# Patient Record
Sex: Female | Born: 1980 | Race: White | Hispanic: No | Marital: Married | Smoking: Never smoker
Health system: Southern US, Community
[De-identification: ages and names within clinical notes are randomized; demographics above are authoritative.]

## PROBLEM LIST (undated history)

## (undated) DIAGNOSIS — F419 Anxiety disorder, unspecified: Secondary | ICD-10-CM

## (undated) DIAGNOSIS — C801 Malignant (primary) neoplasm, unspecified: Secondary | ICD-10-CM

## (undated) DIAGNOSIS — J45909 Unspecified asthma, uncomplicated: Secondary | ICD-10-CM

## (undated) DIAGNOSIS — L509 Urticaria, unspecified: Secondary | ICD-10-CM

## (undated) DIAGNOSIS — J069 Acute upper respiratory infection, unspecified: Secondary | ICD-10-CM

## (undated) DIAGNOSIS — I10 Essential (primary) hypertension: Secondary | ICD-10-CM

## (undated) DIAGNOSIS — J189 Pneumonia, unspecified organism: Secondary | ICD-10-CM

## (undated) HISTORY — PX: WISDOM TOOTH EXTRACTION: SHX21

## (undated) HISTORY — DX: Essential (primary) hypertension: I10

## (undated) HISTORY — DX: Acute upper respiratory infection, unspecified: J06.9

## (undated) HISTORY — DX: Urticaria, unspecified: L50.9

## (undated) HISTORY — DX: Unspecified asthma, uncomplicated: J45.909

---
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
## 2002-01-12 ENCOUNTER — Encounter: Payer: Self-pay | Admitting: *Deleted

## 2002-01-12 ENCOUNTER — Emergency Department (HOSPITAL_COMMUNITY): Admission: EM | Admit: 2002-01-12 | Discharge: 2002-01-12 | Payer: Self-pay | Admitting: *Deleted

## 2002-06-09 ENCOUNTER — Other Ambulatory Visit: Admission: RE | Admit: 2002-06-09 | Discharge: 2002-06-09 | Payer: Self-pay | Admitting: Dermatology

## 2005-04-17 ENCOUNTER — Ambulatory Visit (HOSPITAL_COMMUNITY): Admission: RE | Admit: 2005-04-17 | Discharge: 2005-04-17 | Payer: Self-pay | Admitting: Family Medicine

## 2007-12-03 ENCOUNTER — Emergency Department (HOSPITAL_COMMUNITY): Admission: EM | Admit: 2007-12-03 | Discharge: 2007-12-03 | Payer: Self-pay | Admitting: Emergency Medicine

## 2010-08-13 ENCOUNTER — Encounter: Payer: Self-pay | Admitting: Family Medicine

## 2015-03-21 ENCOUNTER — Other Ambulatory Visit (HOSPITAL_COMMUNITY): Payer: Self-pay | Admitting: Internal Medicine

## 2015-03-21 DIAGNOSIS — R1011 Right upper quadrant pain: Secondary | ICD-10-CM

## 2015-03-23 ENCOUNTER — Ambulatory Visit (HOSPITAL_COMMUNITY)
Admission: RE | Admit: 2015-03-23 | Discharge: 2015-03-23 | Disposition: A | Discharge status: Home / Self Care | Payer: BC Managed Care – PPO | Source: Ambulatory Visit | Attending: Internal Medicine | Admitting: Internal Medicine

## 2015-03-23 DIAGNOSIS — K760 Fatty (change of) liver, not elsewhere classified: Secondary | ICD-10-CM | POA: Diagnosis not present

## 2015-03-23 DIAGNOSIS — R1011 Right upper quadrant pain: Secondary | ICD-10-CM | POA: Diagnosis present

## 2015-03-23 DIAGNOSIS — R110 Nausea: Secondary | ICD-10-CM | POA: Diagnosis not present

## 2015-04-18 ENCOUNTER — Encounter: Payer: Self-pay | Admitting: General Surgery

## 2015-04-18 NOTE — Progress Notes (Signed)
Brianna Bowen 04/18/2015 1:56 PM Location: [facility] Patient #: [id number] DOB: March 28, 1981 Single / Language: Cleophus Molt / Race: White Female History of Present Illness Odis Hollingshead MD; 04/18/2015 2:25 PM) Patient words: gallstones.  The patient is a 34 year old female    Note:She is referred by Dr. Wende Neighbors because of right upper quadrant pain postprandially and small gallstones. She's been having these symptoms for a couple months now and they're associated with nausea. She also has some heartburn. Recent ultrasound demonstrated fatty infiltration of liver and small echogenic material in the gallbladder consistent with small stones and/or sludge. Symptoms are exacerbated by fatty meal. No family history of gallbladder disease. No history of hepatitis or jaundice. No major weight gain or loss in the past 6 months.  Other Problems Marjean Donna, CMA; 04/18/2015 1:57 PM) Anxiety Disorder Cholelithiasis Melanoma  Past Surgical History Marjean Donna, CMA; 04/18/2015 1:57 PM) Oral Surgery  Diagnostic Studies History Marjean Donna, CMA; 04/18/2015 1:57 PM) Colonoscopy never Pap Smear 1-5 years ago  Allergies Davy Pique Bynum, CMA; 04/18/2015 1:58 PM) Penicillamine *Assorted Classes**  Medication History Davy Pique Bynum, CMA; 04/18/2015 1:58 PM) No Current Medications Medications Reconciled  Social History Marjean Donna, CMA; 04/18/2015 1:57 PM) Alcohol use Moderate alcohol use. No caffeine use No drug use Tobacco use Never smoker.  Family History Marjean Donna, CMA; 04/18/2015 1:57 PM) Arthritis Father, Mother. Cerebrovascular Accident Father. Diabetes Mellitus Father, Mother. Hypertension Mother. Migraine Headache Mother.  Pregnancy / Birth History Marjean Donna, [place]; 04/18/2015 1:57 PM) Age at menarche 81 years. Gravida 0 Regular periods     Review of Systems Davy Pique Bynum CMA; 04/18/2015 1:57 PM) General Not Present- Appetite Loss, Chills,  Fatigue, Fever, Night Sweats, Weight Gain and Weight Loss. Skin Not Present- Change in Wart/Mole, Dryness, Hives, Jaundice, New Lesions, Non-Healing Wounds, Rash and Ulcer. HEENT Not Present- Earache, Hearing Loss, Hoarseness, Nose Bleed, Oral Ulcers, Ringing in the Ears, Seasonal Allergies, Sinus Pain, Sore Throat, Visual Disturbances, Wears glasses/contact lenses and Yellow Eyes. Respiratory Not Present- Bloody sputum, Chronic Cough, Difficulty Breathing, Snoring and Wheezing. Breast Not Present- Breast Mass, Breast Pain, Nipple Discharge and Skin Changes. Cardiovascular Not Present- Chest Pain, Difficulty Breathing Lying Down, Leg Cramps, Palpitations, Rapid Heart Rate, Shortness of Breath and Swelling of Extremities. Gastrointestinal Present- Abdominal Pain, Bloating, Chronic diarrhea, Indigestion and Nausea. Not Present- Bloody Stool, Change in Bowel Habits, Constipation, Difficulty Swallowing, Excessive gas, Gets full quickly at meals, Hemorrhoids, Rectal Pain and Vomiting. Female Genitourinary Not Present- Frequency, Nocturia, Painful Urination, Pelvic Pain and Urgency. Musculoskeletal Not Present- Back Pain, Joint Pain, Joint Stiffness, Muscle Pain, Muscle Weakness and Swelling of Extremities. Neurological Not Present- Decreased Memory, Fainting, Headaches, Numbness, Seizures, Tingling, Tremor, Trouble walking and Weakness. Psychiatric Not Present- Anxiety, Bipolar, Change in Sleep Pattern, Depression, Fearful and Frequent crying. Endocrine Not Present- Cold Intolerance, Excessive Hunger, Hair Changes, Heat Intolerance, Hot flashes and New Diabetes. Hematology Not Present- Easy Bruising, Excessive bleeding, Gland problems, HIV and Persistent Infections.  Vitals (Sonya Bynum CMA; 04/18/2015 1:58 PM) 04/18/2015 1:57 PM Weight: 306 lb Height: 62in Body Surface Area: 2.46 m Body Mass Index: 55.97 kg/m Temp.: 97.74F(Temporal)  Pulse: 71 (Regular)  BP: 138/78 (Sitting, Left Arm,  Standard)     Physical Exam Odis Hollingshead MD; 04/18/2015 2:26 PM)  The physical exam findings are as follows: Note:General: Morbidly obese female in NAD. Pleasant and cooperative.  HEENT: [place]/AT, no facial masses  EYES: EOMI, no icterus  CV: RRR, no murmur, no JVD.  CHEST: Breath  sounds equal and clear. Respirations nonlabored.  ABDOMEN: Soft,obese, nontender, nondistended, no masses, no organomegaly, no scars, no hernias.  SKIN: No jaundice or suspicious rashes.  NEUROLOGIC: Alert and oriented, answers questions appropriately, normal gait and station.  PSYCHIATRIC: Normal mood, affect , and behavior.    Assessment & Plan Odis Hollingshead MD; 04/18/2015 2:27 PM)  SYMPTOMATIC CHOLELITHIASIS (K80.20) Impression: We discussed options of strict low-fat diet and expected management or cholecystectomy. She would like to proceed with the cholecystectomy.  Plan: Laparoscopic cholecystectomy with cholangiogram. I have explained the procedure, risks, and aftercare of cholecystectomy. Risks include but are not limited to bleeding, infection, wound problems, anesthesia, diarrhea, bile leak, injury to common bile duct/liver/intestine. She seems to understand and agrees to proceed.  Jackolyn Confer, MD

## 2015-05-20 ENCOUNTER — Encounter (HOSPITAL_COMMUNITY)
Admission: RE | Admit: 2015-05-20 | Discharge: 2015-05-20 | Disposition: A | Discharge status: Home / Self Care | Payer: BC Managed Care – PPO | Source: Ambulatory Visit | Attending: General Surgery | Admitting: General Surgery

## 2015-05-20 ENCOUNTER — Encounter (HOSPITAL_COMMUNITY): Payer: Self-pay

## 2015-05-20 DIAGNOSIS — Z01812 Encounter for preprocedural laboratory examination: Secondary | ICD-10-CM | POA: Diagnosis not present

## 2015-05-20 DIAGNOSIS — K8020 Calculus of gallbladder without cholecystitis without obstruction: Secondary | ICD-10-CM | POA: Diagnosis not present

## 2015-05-20 HISTORY — DX: Malignant (primary) neoplasm, unspecified: C80.1

## 2015-05-20 HISTORY — DX: Pneumonia, unspecified organism: J18.9

## 2015-05-20 HISTORY — DX: Anxiety disorder, unspecified: F41.9

## 2015-05-20 LAB — CBC WITH DIFFERENTIAL/PLATELET
BASOS PCT: 0 %
Basophils Absolute: 0 10*3/uL (ref 0.0–0.1)
Eosinophils Absolute: 0.1 10*3/uL (ref 0.0–0.7)
Eosinophils Relative: 2 %
HCT: 39.5 % (ref 36.0–46.0)
HEMOGLOBIN: 13.1 g/dL (ref 12.0–15.0)
LYMPHS PCT: 28 %
Lymphs Abs: 2.2 10*3/uL (ref 0.7–4.0)
MCH: 28.7 pg (ref 26.0–34.0)
MCHC: 33.2 g/dL (ref 30.0–36.0)
MCV: 86.6 fL (ref 78.0–100.0)
MONO ABS: 0.5 10*3/uL (ref 0.1–1.0)
Monocytes Relative: 6 %
NEUTROS ABS: 4.8 10*3/uL (ref 1.7–7.7)
NEUTROS PCT: 64 %
PLATELETS: 283 10*3/uL (ref 150–400)
RBC: 4.56 MIL/uL (ref 3.87–5.11)
RDW: 12.8 % (ref 11.5–15.5)
WBC: 7.6 10*3/uL (ref 4.0–10.5)

## 2015-05-20 LAB — COMPREHENSIVE METABOLIC PANEL
ALBUMIN: 3.7 g/dL (ref 3.5–5.0)
ALK PHOS: 101 U/L (ref 38–126)
ALT: 21 U/L (ref 14–54)
ANION GAP: 7 (ref 5–15)
AST: 18 U/L (ref 15–41)
BUN: 12 mg/dL (ref 6–20)
CALCIUM: 9.3 mg/dL (ref 8.9–10.3)
CHLORIDE: 105 mmol/L (ref 101–111)
CO2: 23 mmol/L (ref 22–32)
CREATININE: 0.73 mg/dL (ref 0.44–1.00)
GFR calc non Af Amer: >60 mL/min (ref >60)
GLUCOSE: 108 mg/dL — AB (ref 65–99)
Potassium: 4.1 mmol/L (ref 3.5–5.1)
SODIUM: 135 mmol/L (ref 135–145)
Total Bilirubin: 0.5 mg/dL (ref 0.3–1.2)
Total Protein: 7 g/dL (ref 6.5–8.1)

## 2015-05-20 LAB — PROTIME-INR
INR: 1.03 (ref 0.00–1.49)
Prothrombin Time: 13.7 seconds (ref 11.6–15.2)

## 2015-05-20 LAB — HCG, SERUM, QUALITATIVE: PREG SERUM: NEGATIVE

## 2015-05-20 NOTE — Progress Notes (Signed)
PCP is Dr Wende Neighbors  Denies ever seeing a cardiologist. Denies ever having a card cath, stress test, or echo. Pt reports she had a reaction to PCN -hives Spoke with Jearld Fenton at Dr Bertrum Sol office informed her of pt allergy to PCN, states he will leave the Dr a note and get back to [name].

## 2015-05-20 NOTE — Progress Notes (Signed)
Dr Zella Richer called back and states as long as reaction to PCN is not anaphylaxis ok to give ancef.

## 2015-05-20 NOTE — Pre-Procedure Instructions (Signed)
Gustie Bobb Kohls  05/20/2015      [facility] - [place], [place] [address] Phone: [phone] Fax: [phone]    Your procedure is scheduled on Nov 8th.  Report to [facility] Admitting at 530 A.M.  Call this number if you have problems the morning of surgery:  [phone]   Remember:  Do not eat food or drink liquids after midnight.  Take these medicines the morning of surgery with A SIP OF WATER NA  Stop taking Aspirin, Aleve,Ibuprofen, BC"s, Goody's, Herbal medications, Fish Oil, Vitamins- 7 days prior to surgery   Do not wear jewelry, make-up or nail polish.  Do not wear lotions, powders, or perfumes.  You may wear deodorant.  Do not shave 48 hours prior to surgery.  Men may shave face and neck.  Do not bring valuables to the hospital.  [facility] is not responsible for any belongings or valuables.  Contacts, dentures or bridgework may not be worn into surgery.  Leave your suitcase in the car.  After surgery it may be brought to your room.  For patients admitted to the hospital, discharge time will be determined by your treatment team.  Patients discharged the day of surgery will not be allowed to drive home.   Special instructions:  [facility] - Preparing for Surgery  Before surgery, you can play an important role.  Because skin is not sterile, your skin needs to be as free of germs as possible.  You can reduce the number of germs on you skin by washing with CHG (chlorahexidine gluconate) soap before surgery.  CHG is an antiseptic cleaner which kills germs and bonds with the skin to continue killing germs even after washing.  Please DO NOT use if you have an allergy to CHG or antibacterial soaps.  If your skin becomes reddened/irritated stop using the CHG and inform your nurse when you arrive at Short Stay.  Do not shave (including legs and underarms) for at least 48 hours prior to the first CHG  shower.  You may shave your face.  Please follow these instructions carefully:   1.  Shower with CHG Soap the night before surgery and the  morning of Surgery.  2.  If you choose to wash your hair, wash your hair first as usual with your   normal shampoo.  3.  After you shampoo, rinse your hair and body thoroughly to remove the  Shampoo.  4.  Use CHG as you would any other liquid soap.  You can apply chg directly  to the skin and wash gently with scrungie or a clean washcloth.  5.  Apply the CHG Soap to your body ONLY FROM THE NECK DOWN.   Do not use on open wounds or open sores.  Avoid contact with your eyes, ears, mouth and genitals (private parts).  Wash genitals (private parts)  with your normal soap.  6.  Wash thoroughly, paying special attention to the area where your surgery  will be performed.  7.  Thoroughly rinse your body with warm water from the neck down.  8.  DO NOT shower/wash with your normal soap after using and rinsing off   the CHG Soap.  9.  Pat yourself dry with a clean towel.            10.  Wear clean pajamas.            11.  Place clean sheets on your bed the night of your first shower and do not  sleep with pets.  Day of Surgery  Do not apply any lotions/deoderants the morning of surgery.  Please wear clean clothes to the hospital/surgery center.     Please read over the following fact sheets that you were given. Pain Booklet, Coughing and Deep Breathing and Surgical Site Infection Prevention

## 2015-05-30 ENCOUNTER — Encounter (HOSPITAL_COMMUNITY): Payer: Self-pay | Admitting: Certified Registered Nurse Anesthetist

## 2015-05-30 DIAGNOSIS — K8020 Calculus of gallbladder without cholecystitis without obstruction: Secondary | ICD-10-CM | POA: Diagnosis present

## 2015-05-30 DIAGNOSIS — F419 Anxiety disorder, unspecified: Secondary | ICD-10-CM | POA: Insufficient documentation

## 2015-05-30 DIAGNOSIS — C439 Malignant melanoma of skin, unspecified: Secondary | ICD-10-CM | POA: Insufficient documentation

## 2015-05-30 MED ORDER — VANCOMYCIN HCL 10 G IV SOLR
1500.0000 mg | INTRAVENOUS | Status: AC
  Administered 2015-05-31: 1500 mg via INTRAVENOUS
  Filled 2015-05-30: qty 1500

## 2015-05-30 NOTE — Anesthesia Preprocedure Evaluation (Addendum)
Anesthesia Evaluation  Patient identified by MRN, date of birth, ID band Patient awake  General Assessment Comment: Brianna Bowen is a  pleasant 34 y/o female with history significant for symptomatic cholelithiasis, anxiety, and morbid obesity. Brianna Bowen presents today for a scheduled laparoscopic cholecystectomy with intraoperative cholangiogram by Dr. Zella Richer.   Reviewed: Allergy & Precautions, NPO status , Patient's Chart, lab work & pertinent test results  Airway Mallampati: I  TM Distance: >3 FB Neck ROM: Full    Dental no notable dental hx. (+) Teeth Intact, Dental Advisory Given   Pulmonary pneumonia, resolved,    breath sounds clear to auscultation + decreased breath sounds      Cardiovascular negative cardio ROS   Rhythm:Regular Rate:Normal     Neuro/Psych Anxiety negative neurological ROS     GI/Hepatic negative GI ROS, Neg liver ROS,   Endo/Other  Morbid obesity  Renal/GU negative Renal ROS     Musculoskeletal   Abdominal (+) + obese,  Abdomen: soft. Bowel sounds: normal.  Peds  Hematology   Anesthesia Other Findings Hx of melanoma  Reproductive/Obstetrics                          Lab Results  Component Value Date   WBC 7.6 05/20/2015   HGB 13.1 05/20/2015   HCT 39.5 05/20/2015   MCV 86.6 05/20/2015   PLT 283 05/20/2015     Chemistry      Component Value Date/Time   NA 135 05/20/2015 1037   K 4.1 05/20/2015 1037   CL 105 05/20/2015 1037   CO2 23 05/20/2015 1037   BUN 12 05/20/2015 1037   CREATININE 0.73 05/20/2015 1037      Component Value Date/Time   CALCIUM 9.3 05/20/2015 1037   ALKPHOS 101 05/20/2015 1037   AST 18 05/20/2015 1037   ALT 21 05/20/2015 1037   BILITOT 0.5 05/20/2015 1037     CT Head 12/03/07  IMPRESSION:  1. No acute intracranial abnormality. 2. 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  Anesthesia  Physical Anesthesia Plan  ASA: III  Anesthesia Plan: General   Post-op Pain Management:    Induction: Intravenous  Airway Management Planned: Oral ETT  Additional Equipment:   Intra-op Plan:   Post-operative Plan: Extubation in OR  Informed Consent: I have reviewed the patients History and Physical, chart, labs and discussed the procedure including the risks, benefits and alternatives for the proposed anesthesia with the patient or authorized representative who has indicated his/her understanding and acceptance.   Dental advisory given  Plan Discussed with: CRNA  Anesthesia Plan Comments:        Anesthesia Quick Evaluation

## 2015-05-31 ENCOUNTER — Encounter (HOSPITAL_COMMUNITY)
Admission: RE | Disposition: A | Discharge status: Home / Self Care | Payer: Self-pay | Source: Ambulatory Visit | Attending: General Surgery

## 2015-05-31 ENCOUNTER — Ambulatory Visit (HOSPITAL_COMMUNITY): Payer: BC Managed Care – PPO | Admitting: Certified Registered Nurse Anesthetist

## 2015-05-31 ENCOUNTER — Ambulatory Visit (HOSPITAL_COMMUNITY)
Admission: RE | Admit: 2015-05-31 | Discharge: 2015-05-31 | Disposition: A | Discharge status: Home / Self Care | Payer: BC Managed Care – PPO | Source: Ambulatory Visit | Attending: General Surgery | Admitting: General Surgery

## 2015-05-31 ENCOUNTER — Encounter (HOSPITAL_COMMUNITY): Payer: Self-pay | Admitting: *Deleted

## 2015-05-31 ENCOUNTER — Ambulatory Visit (HOSPITAL_COMMUNITY): Payer: BC Managed Care – PPO

## 2015-05-31 DIAGNOSIS — K824 Cholesterolosis of gallbladder: Secondary | ICD-10-CM | POA: Insufficient documentation

## 2015-05-31 DIAGNOSIS — K8020 Calculus of gallbladder without cholecystitis without obstruction: Secondary | ICD-10-CM | POA: Diagnosis not present

## 2015-05-31 HISTORY — PX: CHOLECYSTECTOMY: SHX55

## 2015-05-31 SURGERY — LAPAROSCOPIC CHOLECYSTECTOMY WITH INTRAOPERATIVE CHOLANGIOGRAM
Anesthesia: General | Site: Abdomen

## 2015-05-31 MED ORDER — ROCURONIUM BROMIDE 50 MG/5ML IV SOLN
INTRAVENOUS | Status: AC
  Filled 2015-05-31: qty 1

## 2015-05-31 MED ORDER — ACETAMINOPHEN 325 MG PO TABS: 650.0000 mg | ORAL_TABLET | ORAL | Status: DC | PRN

## 2015-05-31 MED ORDER — BUPIVACAINE-EPINEPHRINE (PF) 0.25% -1:200000 IJ SOLN
INTRAMUSCULAR | Status: AC
  Filled 2015-05-31: qty 30

## 2015-05-31 MED ORDER — MIDAZOLAM HCL 5 MG/5ML IJ SOLN
INTRAMUSCULAR | Status: DC | PRN
  Administered 2015-05-31 (×2): 1 mg via INTRAVENOUS

## 2015-05-31 MED ORDER — GLYCOPYRROLATE 0.2 MG/ML IJ SOLN
INTRAMUSCULAR | Status: DC | PRN
  Administered 2015-05-31: .8 mg via ORAL

## 2015-05-31 MED ORDER — ONDANSETRON HCL 4 MG/2ML IJ SOLN: 4.0000 mg | INTRAMUSCULAR | Status: DC | PRN

## 2015-05-31 MED ORDER — PROPOFOL 10 MG/ML IV BOLUS
INTRAVENOUS | Status: AC
  Filled 2015-05-31: qty 20

## 2015-05-31 MED ORDER — ACETAMINOPHEN 650 MG RE SUPP: 650.0000 mg | RECTAL | Status: DC | PRN

## 2015-05-31 MED ORDER — PROPOFOL 10 MG/ML IV BOLUS
INTRAVENOUS | Status: DC | PRN
  Administered 2015-05-31: 280 mg via INTRAVENOUS
  Administered 2015-05-31: 20 mg via INTRAVENOUS

## 2015-05-31 MED ORDER — LACTATED RINGERS IV SOLN
INTRAVENOUS | Status: DC | PRN
  Administered 2015-05-31: 07:00:00 via INTRAVENOUS

## 2015-05-31 MED ORDER — SODIUM CHLORIDE 0.9 % IR SOLN
Status: DC | PRN
  Administered 2015-05-31: 1000 mL

## 2015-05-31 MED ORDER — GLYCOPYRROLATE 0.2 MG/ML IJ SOLN
INTRAMUSCULAR | Status: AC
  Filled 2015-05-31: qty 1

## 2015-05-31 MED ORDER — BUPIVACAINE-EPINEPHRINE 0.25% -1:200000 IJ SOLN
INTRAMUSCULAR | Status: DC | PRN
  Administered 2015-05-31: 15 mL

## 2015-05-31 MED ORDER — HYDROMORPHONE HCL 1 MG/ML IJ SOLN
INTRAMUSCULAR | Status: AC
  Filled 2015-05-31: qty 1

## 2015-05-31 MED ORDER — FENTANYL CITRATE (PF) 250 MCG/5ML IJ SOLN
INTRAMUSCULAR | Status: AC
  Filled 2015-05-31: qty 5

## 2015-05-31 MED ORDER — ONDANSETRON HCL 4 MG PO TABS: 4.0000 mg | ORAL_TABLET | ORAL | Status: DC | PRN

## 2015-05-31 MED ORDER — DEXTROSE IN LACTATED RINGERS 5 % IV SOLN: INTRAVENOUS | Status: DC

## 2015-05-31 MED ORDER — ONDANSETRON HCL 4 MG/2ML IJ SOLN
INTRAMUSCULAR | Status: AC
  Filled 2015-05-31: qty 2

## 2015-05-31 MED ORDER — 0.9 % SODIUM CHLORIDE (POUR BTL) OPTIME
TOPICAL | Status: DC | PRN
  Administered 2015-05-31: 1000 mL

## 2015-05-31 MED ORDER — MEPERIDINE HCL 25 MG/ML IJ SOLN: 6.2500 mg | INTRAMUSCULAR | Status: DC | PRN

## 2015-05-31 MED ORDER — NEOSTIGMINE METHYLSULFATE 10 MG/10ML IV SOLN
INTRAVENOUS | Status: DC | PRN
  Administered 2015-05-31: 5 mg via INTRAVENOUS

## 2015-05-31 MED ORDER — MORPHINE SULFATE (PF) 2 MG/ML IV SOLN: 2.0000 mg | INTRAVENOUS | Status: DC | PRN

## 2015-05-31 MED ORDER — OXYCODONE HCL 5 MG PO TABS: 5.0000 mg | ORAL_TABLET | ORAL | Status: DC | PRN

## 2015-05-31 MED ORDER — LIDOCAINE HCL (CARDIAC) 20 MG/ML IV SOLN
INTRAVENOUS | Status: DC | PRN
  Administered 2015-05-31: 100 mg via INTRAVENOUS

## 2015-05-31 MED ORDER — ONDANSETRON HCL 4 MG/2ML IJ SOLN
INTRAMUSCULAR | Status: DC | PRN
  Administered 2015-05-31: 4 mg via INTRAVENOUS

## 2015-05-31 MED ORDER — SUCCINYLCHOLINE CHLORIDE 20 MG/ML IJ SOLN
INTRAMUSCULAR | Status: AC
  Filled 2015-05-31: qty 1

## 2015-05-31 MED ORDER — PROMETHAZINE HCL 25 MG/ML IJ SOLN: 6.2500 mg | INTRAMUSCULAR | Status: DC | PRN

## 2015-05-31 MED ORDER — EPHEDRINE SULFATE 50 MG/ML IJ SOLN
INTRAMUSCULAR | Status: AC
  Filled 2015-05-31: qty 1

## 2015-05-31 MED ORDER — LIDOCAINE HCL (CARDIAC) 20 MG/ML IV SOLN
INTRAVENOUS | Status: AC
  Filled 2015-05-31: qty 5

## 2015-05-31 MED ORDER — FENTANYL CITRATE (PF) 100 MCG/2ML IJ SOLN
INTRAMUSCULAR | Status: DC | PRN
  Administered 2015-05-31 (×5): 50 ug via INTRAVENOUS

## 2015-05-31 MED ORDER — DEXAMETHASONE SODIUM PHOSPHATE 10 MG/ML IJ SOLN
INTRAMUSCULAR | Status: DC | PRN
  Administered 2015-05-31: 10 mg via INTRAVENOUS

## 2015-05-31 MED ORDER — MIDAZOLAM HCL 2 MG/2ML IJ SOLN
INTRAMUSCULAR | Status: AC
  Filled 2015-05-31: qty 4

## 2015-05-31 MED ORDER — ROCURONIUM BROMIDE 100 MG/10ML IV SOLN
INTRAVENOUS | Status: DC | PRN
  Administered 2015-05-31: 40 mg via INTRAVENOUS
  Administered 2015-05-31 (×2): 10 mg via INTRAVENOUS

## 2015-05-31 MED ORDER — DEXAMETHASONE SODIUM PHOSPHATE 10 MG/ML IJ SOLN
INTRAMUSCULAR | Status: AC
  Filled 2015-05-31: qty 1

## 2015-05-31 MED ORDER — HYDROMORPHONE HCL 1 MG/ML IJ SOLN
0.2500 mg | INTRAMUSCULAR | Status: DC | PRN
  Administered 2015-05-31 (×2): 0.25 mg via INTRAVENOUS

## 2015-05-31 MED ORDER — STERILE WATER FOR INJECTION IJ SOLN
INTRAMUSCULAR | Status: AC
  Filled 2015-05-31: qty 10

## 2015-05-31 MED ORDER — IOHEXOL 300 MG/ML  SOLN
INTRAMUSCULAR | Status: DC | PRN
  Administered 2015-05-31: 6 mL

## 2015-05-31 SURGICAL SUPPLY — 51 items
APL SKNCLS STERI-STRIP NONHPOA (GAUZE/BANDAGES/DRESSINGS) ×1
APPLIER CLIP 5 13 M/L LIGAMAX5 (MISCELLANEOUS) ×3
APR CLP MED LRG 5 ANG JAW (MISCELLANEOUS) ×1
BAG SPEC RTRVL LRG 6X4 10 (ENDOMECHANICALS) ×1
BENZOIN TINCTURE PRP APPL 2/3 (GAUZE/BANDAGES/DRESSINGS) ×3 IMPLANT
CANISTER SUCTION 2500CC (MISCELLANEOUS) ×3 IMPLANT
CATH REDDICK CHOLANGI 4FR 50CM (CATHETERS) ×3 IMPLANT
CHLORAPREP W/TINT 26ML (MISCELLANEOUS) ×3 IMPLANT
CLIP APPLIE 5 13 M/L LIGAMAX5 (MISCELLANEOUS) ×1 IMPLANT
CLOSURE WOUND 1/2 X4 (GAUZE/BANDAGES/DRESSINGS) ×1
COVER MAYO STAND STRL (DRAPES) ×3 IMPLANT
COVER SURGICAL LIGHT HANDLE (MISCELLANEOUS) ×3 IMPLANT
DECANTER SPIKE VIAL GLASS SM (MISCELLANEOUS) ×4 IMPLANT
DRAPE C-ARM 42X72 X-RAY (DRAPES) ×3 IMPLANT
DRSG TEGADERM 2-3/8X2-3/4 SM (GAUZE/BANDAGES/DRESSINGS) ×14 IMPLANT
ELECT REM PT RETURN 9FT ADLT (ELECTROSURGICAL) ×3
ELECTRODE REM PT RTRN 9FT ADLT (ELECTROSURGICAL) ×1 IMPLANT
GAUZE SPONGE 2X2 8PLY STRL LF (GAUZE/BANDAGES/DRESSINGS) ×1 IMPLANT
GLOVE BIO SURGEON STRL SZ7 (GLOVE) ×2 IMPLANT
GLOVE BIO SURGEON STRL SZ7.5 (GLOVE) ×2 IMPLANT
GLOVE BIOGEL PI IND STRL 7.0 (GLOVE) IMPLANT
GLOVE BIOGEL PI IND STRL 7.5 (GLOVE) IMPLANT
GLOVE BIOGEL PI IND STRL 8 (GLOVE) ×1 IMPLANT
GLOVE BIOGEL PI INDICATOR 7.0 (GLOVE) ×2
GLOVE BIOGEL PI INDICATOR 7.5 (GLOVE) ×4
GLOVE BIOGEL PI INDICATOR 8 (GLOVE) ×2
GLOVE ECLIPSE 7.5 STRL STRAW (GLOVE) ×2 IMPLANT
GLOVE ECLIPSE 8.0 STRL XLNG CF (GLOVE) ×3 IMPLANT
GOWN STRL REUS W/ TWL LRG LVL3 (GOWN DISPOSABLE) ×3 IMPLANT
GOWN STRL REUS W/TWL LRG LVL3 (GOWN DISPOSABLE) ×9
IV CATH 14GX2 1/4 (CATHETERS) ×3 IMPLANT
KIT BASIN OR (CUSTOM PROCEDURE TRAY) ×3 IMPLANT
KIT ROOM TURNOVER OR (KITS) ×3 IMPLANT
NS IRRIG 1000ML POUR BTL (IV SOLUTION) ×3 IMPLANT
PAD ARMBOARD 7.5X6 YLW CONV (MISCELLANEOUS) ×3 IMPLANT
POUCH SPECIMEN RETRIEVAL 10MM (ENDOMECHANICALS) ×3 IMPLANT
SCISSORS LAP 5X35 DISP (ENDOMECHANICALS) ×3 IMPLANT
SET IRRIG TUBING LAPAROSCOPIC (IRRIGATION / IRRIGATOR) ×3 IMPLANT
SLEEVE ENDOPATH XCEL 5M (ENDOMECHANICALS) ×8 IMPLANT
SPECIMEN JAR SMALL (MISCELLANEOUS) ×3 IMPLANT
SPONGE GAUZE 2X2 STER 10/PKG (GAUZE/BANDAGES/DRESSINGS) ×2
STRIP CLOSURE SKIN 1/2X4 (GAUZE/BANDAGES/DRESSINGS) ×1 IMPLANT
SUT MNCRL AB 4-0 PS2 18 (SUTURE) ×2 IMPLANT
SUT MON AB 4-0 PC3 18 (SUTURE) ×3 IMPLANT
TOWEL OR 17X24 6PK STRL BLUE (TOWEL DISPOSABLE) ×1 IMPLANT
TOWEL OR 17X26 10 PK STRL BLUE (TOWEL DISPOSABLE) ×3 IMPLANT
TRAY LAPAROSCOPIC MC (CUSTOM PROCEDURE TRAY) ×3 IMPLANT
TROCAR XCEL BLUNT TIP 100MML (ENDOMECHANICALS) ×3 IMPLANT
TROCAR XCEL NON-BLD 11X100MML (ENDOMECHANICALS) IMPLANT
TROCAR XCEL NON-BLD 5MMX100MML (ENDOMECHANICALS) ×3 IMPLANT
TUBING INSUFFLATION (TUBING) ×3 IMPLANT

## 2015-05-31 NOTE — Op Note (Signed)
OPERATIVE NOTE-LAP CHOLECYSTECTOMY  Preoperative diagnosis:  Symptomatic cholelithiasis  Postoperative diagnosis:  same  Procedure: Laparoscopic cholecystectomy with cholangiogram.  Surgeon: Jackolyn Confer, M.D.  Asst.:  Ralene Ok,  M.D.  Anesthesia: General  Indication:   This is a 34 year old female with symptomatic cholelithiasis. She now presents for elective laparoscopic cholecystectomy.    Technique: She was brought to the operating room, placed supine on the operating table, and a general anesthetic was administered. The hair on the abdominal wall was clipped as was necessary. The abdominal wall was then sterilely prepped and draped. Local anesthetic (Marcaine) was infiltrated in the subumbilical region. A small subumbilical incision was made through the skin, subcutaneous tissue, fascia, and peritoneum entering the peritoneal cavity under direct vision. A pursestring suture of 0 Vicryl was placed around the edges of the fascia. A Hassan trocar was introduced into the peritoneal cavity and a pneumoperitoneum was created by insufflation of carbon dioxide gas. The laparoscope was introduced into the trocar and no underlying bleeding or organ injury was noted. He/She was then placed in the reverse Trendelenburg position with the right side tilted slightly up.  Three 5 mm trocars were then placed into the abdominal cavity under laparoscopic vision. One in the epigastric area, and 2 in the right upper quadrant area. She was morbidly obese and had a large amount of omentum obstructing the infundibular area of the gallbladder. Because of this, another 5 mm trocar was placed in the midepigastric area and the omentum was retracted inferiorly.The gallbladder was visualized and the fundus was grasped and retracted toward the right shoulder.  The infundibulum was mobilized with dissection close to the gallbladder and retracted laterally. The cystic duct was identified and a window was created  around it. The cystic artery was also identified and a window was created around it. The critical view was achieved. A clip was placed at the neck of the gallbladder. A small incision was made in the cystic duct. A cholangiocatheter was introduced through the anterior abdominal wall and placed in the cystic duct. A intraoperative cholangiogram was then performed.  Under real-time fluoroscopy, dilute contrast was injected into the cystic duct.  The common hepatic duct, the right and left hepatic ducts, and the common duct were all visualized. Contrast drained into the duodenum without obvious evidence of any obstructing ductal lesion. The final report is pending the Radiologist's interpretation.  The cholangiocatheter was removed, the cystic duct was clipped 3 times on the biliary side, and then the cystic duct was divided sharply. No bile leak was noted from the cystic duct stump.  The cystic artery was then clipped and divided. Following this the gallbladder was dissected free from the liver using electrocautery. The gallbladder was then placed in a retrieval bag and removed from the abdominal cavity through the subumbilical incision.  Multiple small gallstones were palpable in the gallbladder.  The gallbladder fossa was inspected, irrigated, and bleeding was controlled with electrocautery. Inspection showed that hemostasis was adequate and there was no evidence of bile leak.  The irrigation fluid was evacuated as much as possible.  The subumbilical trocar was removed and the fascial defect was closed by tightening and tying down the pursestring suture under laparoscopic vision.  The remaining trocars were removed and the pneumoperitoneum was released. The skin incisions were closed with 4-0 Monocryl subcuticular stitches. Steri-Strips and sterile dressings were applied.  The procedure was well-tolerated without any apparent complications. She was taken to the recovery room in satisfactory  condition.

## 2015-05-31 NOTE — H&P (Signed)
H & P   Brianna Bowen was referred by Dr. Wende Neighbors because of right upper quadrant pain postprandially and small gallstones. She's been having these symptoms for a couple months now and they're associated with nausea. She also has some heartburn. Recent ultrasound demonstrated fatty infiltration of liver and small echogenic material in the gallbladder consistent with small stones and/or sludge. Symptoms are exacerbated by fatty meal. No family history of gallbladder disease. No history of hepatitis or jaundice. No major weight gain or loss in the past 6 months.  Other Problems  Anxiety Disorder Cholelithiasis Melanoma  Past Surgical History  Oral Surgery  Diagnostic Studies History  Colonoscopy never Pap Smear 1-5 years ago  Allergies  Penicillamine *Assorted Classes**   Prior to Admission medications   Not on File     Social History  Alcohol use Moderate alcohol use. No caffeine use No drug use Tobacco use Never smoker.  Family History  Arthritis Father, Mother. Cerebrovascular Accident Father. Diabetes Mellitus Father, Mother. Hypertension Mother. Migraine Headache Mother.  Pregnancy / Birth History  Age at menarche 11 years. Gravida 0 Regular periods     Review of Systems  General Not Present- Appetite Loss, Chills, Fatigue, Fever, Night Sweats, Weight Gain and Weight Loss. Skin Not Present- Change in Wart/Mole, Dryness, Hives, Jaundice, New Lesions, Non-Healing Wounds, Rash and Ulcer. HEENT Not Present- Earache, Hearing Loss, Hoarseness, Nose Bleed, Oral Ulcers, Ringing in the Ears, Seasonal Allergies, Sinus Pain, Sore Throat, Visual Disturbances, Wears glasses/contact lenses and Yellow Eyes. Respiratory Not Present- Bloody sputum, Chronic Cough, Difficulty Breathing, Snoring and Wheezing. Breast Not Present- Breast Mass, Breast Pain, Nipple Discharge and Skin Changes. Cardiovascular Not Present- Chest Pain, Difficulty Breathing  Lying Down, Leg Cramps, Palpitations, Rapid Heart Rate, Shortness of Breath and Swelling of Extremities. Gastrointestinal Present- Abdominal Pain, Bloating, Chronic diarrhea, Indigestion and Nausea. Not Present- Bloody Stool, Change in Bowel Habits, Constipation, Difficulty Swallowing, Excessive gas, Gets full quickly at meals, Hemorrhoids, Rectal Pain and Vomiting. Female Genitourinary Not Present- Frequency, Nocturia, Painful Urination, Pelvic Pain and Urgency. Musculoskeletal Not Present- Back Pain, Joint Pain, Joint Stiffness, Muscle Pain, Muscle Weakness and Swelling of Extremities. Neurological Not Present- Decreased Memory, Fainting, Headaches, Numbness, Seizures, Tingling, Tremor, Trouble walking and Weakness. Psychiatric Not Present- Anxiety, Bipolar, Change in Sleep Pattern, Depression, Fearful and Frequent crying. Endocrine Not Present- Cold Intolerance, Excessive Hunger, Hair Changes, Heat Intolerance, Hot flashes and New Diabetes. Hematology Not Present- Easy Bruising, Excessive bleeding, Gland problems, HIV and Persistent Infections.  Physical Exam  The physical exam findings are as follows: Note:General: Morbidly obese female in NAD. Pleasant and cooperative.  HEENT: [place]/AT, no facial masses  EYES: EOMI, no icterus  CV: RRR, no murmur, no JVD.  CHEST: Breath sounds equal and clear. Respirations nonlabored.  ABDOMEN: Soft,obese, nontender, nondistended, no masses, no organomegaly, no scars, no hernias.  SKIN: No jaundice or suspicious rashes.  NEUROLOGIC: Alert and oriented, answers questions appropriately, normal gait and station.  PSYCHIATRIC: Normal mood, affect , and behavior.    Assessment & Plan  SYMPTOMATIC CHOLELITHIASIS (K80.20) Impression: We discussed options of strict low-fat diet and expected management or cholecystectomy. She would like to proceed with the cholecystectomy.  Plan: Laparoscopic cholecystectomy with cholangiogram. I have explained the  procedure, risks, and aftercare of cholecystectomy. Risks include but are not limited to bleeding, infection, wound problems, anesthesia, diarrhea, bile leak, injury to common bile duct/liver/intestine. She seems to understand and agrees to proceed.  Jackolyn Confer, MD

## 2015-05-31 NOTE — Anesthesia Postprocedure Evaluation (Signed)
  Anesthesia Post-op Note  Patient: Brianna Bowen  Procedure(s) Performed: Procedure(s): LAPAROSCOPIC CHOLECYSTECTOMY WITH INTRAOPERATIVE CHOLANGIOGRAM (N/A)  Patient Location: PACU  Anesthesia Type:General  Level of Consciousness: awake, alert  and oriented  Airway and Oxygen Therapy: Patient Spontanous Breathing  Post-op Pain: mild  Post-op Assessment: Post-op Vital signs reviewed and Patient's Cardiovascular Status Stable              Post-op Vital Signs: Reviewed  Last Vitals:  Filed Vitals:   05/31/15 1054  BP:   Pulse:   Temp: 36.2 C  Resp:     Complications: No apparent anesthesia complications

## 2015-05-31 NOTE — Anesthesia Procedure Notes (Signed)
Procedure Name: Intubation Date/Time: 05/31/2015 7:36 AM Performed by: Layla Maw Pre-anesthesia Checklist: Patient identified, Patient being monitored, Timeout performed, Emergency Drugs available and Suction available Patient Re-evaluated:Patient Re-evaluated prior to inductionOxygen Delivery Method: Circle System Utilized Preoxygenation: Pre-oxygenation with 100% oxygen Intubation Type: IV induction Ventilation: Mask ventilation without difficulty Laryngoscope Size: Miller and 3 Grade View: Grade I Tube type: Oral Tube size: 7.5 mm Number of attempts: 1 Airway Equipment and Method: Stylet and Patient positioned with wedge pillow Placement Confirmation: ETT inserted through vocal cords under direct vision,  positive ETCO2 and breath sounds checked- equal and bilateral Secured at: 21 cm Tube secured with: Tape Dental Injury: Teeth and Oropharynx as per pre-operative assessment

## 2015-05-31 NOTE — Discharge Instructions (Signed)
LAPAROSCOPIC SURGERY: POST OP INSTRUCTIONS  1. DIET: Follow a liquid diet with crackers and toast  the first 48 hours after arrival home, such as soup, liquids, crackers, etc.  Be sure to include lots of fluids daily.  Avoid fast food or heavy meals as your are more likely to get nauseated.  Eat a lowfat foods beginning your second day after surgery.   2. Take your usually prescribed home medications unless otherwise directed. 3. PAIN CONTROL: a. Pain is best controlled by a usual combination of three different methods TOGETHER: i. Ice/Heat ii. Over the counter pain medication iii. Prescription pain medication b. Most patients will experience some swelling and bruising around the incisions.  Ice packs or heating pads (30-60 minutes up to 6 times a day) will help. Use ice for the first few days to help decrease swelling and bruising, then switch to heat to help relax tight/sore spots and speed recovery.  Some people prefer to use ice alone, heat alone, alternating between ice & heat.  Experiment to what works for you.  Swelling and bruising can take several weeks to resolve.   c. It is helpful to take an over-the-counter pain medication regularly for the first few weeks.  Choose one of the following that works best for you: i. Naproxen (Aleve, etc)  Two 220mg  tabs twice a day ii. Ibuprofen (Advil, etc) Three 200mg  tabs four times a day (every meal & bedtime) iii. Acetaminophen (Tylenol, etc) 500-650mg  four times a day (every meal & bedtime) d. A  prescription for pain medication (such as oxycodone, hydrocodone, etc) should be given to you upon discharge.  Take your pain medication as prescribed.  i. If you are having problems/concerns with the prescription medicine (does not control pain, nausea, vomiting, rash, itching, etc), please call us [phone] to see if we need to switch you to a different pain medicine that will work better for you and/or control your side effect better. ii. If you need  a refill on your pain medication, please contact your pharmacy.  They will contact our office to request authorization. Prescriptions will not be filled after 5 pm or on week-ends. 4. Avoid getting constipated.  Between the surgery and the pain medications, it is common to experience some constipation.  Increasing fluid intake and taking a fiber supplement (such as Metamucil, Citrucel, FiberCon, MiraLax, etc) 1-2 times a day regularly will usually help prevent this problem from occurring.  A mild laxative (prune juice, Milk of Magnesia, MiraLax, etc) should be taken according to package directions if there are no bowel movements after 48 hours.   5. Watch out for diarrhea.  If you have many loose bowel movements, simplify your diet to bland foods & liquids for a few days.  Stop any stool softeners and decrease your fiber supplement.  Switching to mild anti-diarrheal medications (Kayopectate, Pepto Bismol) can help.  If this worsens or does not improve, please call us. 6. Wash / shower every day.  You may shower over the dressings as they are waterproof.  Continue to shower over incision(s) after the dressing is off. 7. Remove your waterproof bandages 3 days after surgery. Leave steri strips on until they fall off. You may leave the incision open to air.  You may replace a dressing/Band-Aid to cover the incision for comfort if you wish.  8. ACTIVITIES as tolerated:   a. You may resume regular (light) daily activities beginning the next day--such as daily self-care, walking, climbing stairs--gradually increasing light activities  as tolerated.  No heavy lifting (over 10 pounds), straining, or intense activities for 2 weeks. b. DO NOT PUSH THROUGH PAIN.  Let pain be your guide: If it hurts to do something, don't do it.  Pain is your body warning you to avoid that activity for another week until the pain goes down. c. You may drive when you are no longer taking prescription pain medication, you can comfortably  wear a seatbelt, and you can safely maneuver your car and apply brakes. d. Dennis Bast may have sexual intercourse when it is comfortable.  9. FOLLOW UP in our office a. Please call [facility] at [phone] to set up an appointment to see your surgeon in the office for a follow-up appointment approximately 2-3 weeks after your surgery. b. Make sure that you call for this appointment the day you arrive home to insure a convenient appointment time. 10. IF YOU HAVE DISABILITY OR FAMILY LEAVE FORMS, BRING THEM TO THE OFFICE FOR PROCESSING.  DO NOT GIVE THEM TO YOUR DOCTOR.  11.  Return to work/school:  Desk work/light activities in 5-7 days, full duty/activities in 2 weeks if pain-free.   WHEN TO CALL us [phone]: 1. Poor pain control 2. Reactions / problems with new medications (rash/itching, nausea, etc)  3. Fever over 101.5 F (38.5 C) 4. Inability to urinate 5. Nausea and/or vomiting 6. Worsening swelling or bruising 7. Continued bleeding from incision. 8. Increased pain, redness, or drainage from the incision   The clinic staff is available to answer your questions during regular business hours (8:30am-5pm).  Please dont hesitate to call and ask to speak to one of our nurses for clinical concerns.   If you have a medical emergency, go to the nearest emergency room or call 911.  A surgeon from [facility] is always on call at the [facility], [place], [place], [place], [place]  [address] ? MAIN: [phone] ? TOLL FREE: [phone] ?  FAX [phone] [email or web address]

## 2015-05-31 NOTE — Transfer of Care (Signed)
Immediate Anesthesia Transfer of Care Note  Patient: Brianna Bowen  Procedure(s) Performed: Procedure(s): LAPAROSCOPIC CHOLECYSTECTOMY WITH INTRAOPERATIVE CHOLANGIOGRAM (N/A)  Patient Location: PACU  Anesthesia Type:General  Level of Consciousness: awake, alert , oriented and patient cooperative  Airway & Oxygen Therapy: Patient Spontanous Breathing and Patient connected to nasal cannula oxygen  Post-op Assessment: Report given to RN, Post -op Vital signs reviewed and stable and Patient moving all extremities X 4  Post vital signs: Reviewed and stable  Last Vitals:  Filed Vitals:   05/31/15 0850  BP:   Pulse: 76  Temp: 36.6 C  Resp: 21    Complications: No apparent anesthesia complications

## 2015-06-01 ENCOUNTER — Encounter (HOSPITAL_COMMUNITY): Payer: Self-pay | Admitting: General Surgery

## 2015-06-22 ENCOUNTER — Encounter: Payer: Self-pay | Admitting: Family Medicine

## 2015-06-22 ENCOUNTER — Ambulatory Visit (INDEPENDENT_AMBULATORY_CARE_PROVIDER_SITE_OTHER): Payer: BC Managed Care – PPO | Admitting: Family Medicine

## 2015-06-22 DIAGNOSIS — R058 Other specified cough: Secondary | ICD-10-CM

## 2015-06-22 DIAGNOSIS — R05 Cough: Secondary | ICD-10-CM

## 2015-06-22 MED ORDER — ALBUTEROL SULFATE HFA 108 (90 BASE) MCG/ACT IN AERS: 2.0000 | INHALATION_SPRAY | RESPIRATORY_TRACT | Status: DC | PRN

## 2015-06-22 MED ORDER — HYDROCOD POLST-CPM POLST ER 10-8 MG/5ML PO SUER: 5.0000 mL | Freq: Two times a day (BID) | ORAL | Status: DC | PRN

## 2015-06-22 NOTE — Progress Notes (Signed)
Subjective:    Patient ID: Brianna Bowen, female    DOB: 1981-01-26, 34 y.o.   MRN: [id number]  HPI This is a pleasant 34 yo female who has had a cough for 5 weeks. She started with nasal congestion and slight sore throat, these symptoms resolved, but has had persistent cough. Some sputum in the morning. Dry rest of the day. She has not history of smoking, allergies or asthma. She had a lap choley 3 weeks ago and did not cough while in the hospital. She feels her recovery is going well and she feels well other than cough which is keeping her up at night. She has been taking Mucinex without relief.   Past Medical History  Diagnosis Date  . Pneumonia   . Anxiety   . Cancer ([place])      skin cancer melamona   Past Surgical History  Procedure Laterality Date  . Wisdom tooth extraction    . Cholecystectomy N/A 05/31/2015    Procedure: LAPAROSCOPIC CHOLECYSTECTOMY WITH INTRAOPERATIVE CHOLANGIOGRAM;  Surgeon: Jackolyn Confer, MD;  Location: [facility];  Service: General;  Laterality: N/A;   Family History  Problem Relation Age of Onset  . Diabetes Mother   . Hypertension Mother   . Hyperlipidemia Mother   . Diabetes Maternal Grandmother   . Cancer Maternal Grandmother     ovarian  . Hyperlipidemia Maternal Grandmother   . Emphysema Maternal Grandfather   . Diabetes Maternal Grandfather   . Heart disease Maternal Grandfather   . Hypertension Maternal Grandfather   . Diabetes Paternal Grandfather   . Heart disease Paternal Grandfather   . Hyperlipidemia Paternal Grandfather   . Hypertension Paternal Grandfather    Social History  Substance Use Topics  . Smoking status: Never Smoker   . Smokeless tobacco: None  . Alcohol Use: 0.6 - 1.2 oz/week    1-2 Standard drinks or equivalent per week     Comment: socially wine - monthly    Review of Systems  Constitutional: Negative for fever, chills and fatigue.  HENT: Negative for ear pain, postnasal drip, rhinorrhea and sinus pressure.     Respiratory: Positive for cough. Negative for shortness of breath and wheezing.   Cardiovascular: Negative for chest pain, palpitations and leg swelling.      Objective:   Physical Exam  Constitutional: She is oriented to person, place, and time. She appears well-developed and well-nourished. No distress.  HENT:  Head: Normocephalic and atraumatic.  Right Ear: Tympanic membrane, external ear and ear canal normal.  Left Ear: Tympanic membrane, external ear and ear canal normal.  Nose: Nose normal.  Mouth/Throat: Oropharynx is clear and moist. No oropharyngeal exudate.  Eyes: Conjunctivae are normal.  Neck: Normal range of motion. Neck supple.  Cardiovascular: Normal rate, regular rhythm and normal heart sounds.   Pulmonary/Chest: Effort normal and breath sounds normal. No respiratory distress. She has no wheezes. She has no rales. She exhibits no tenderness.  Musculoskeletal: Normal range of motion.  Neurological: She is alert and oriented to person, place, and time.  Skin: Skin is warm and dry. She is not diaphoretic.  Psychiatric: She has a normal mood and affect. Her behavior is normal. Judgment and thought content normal.  Vitals reviewed.  BP 134/90 mmHg  Pulse 82  Temp(Src) 97.9 F (36.6 C) (Oral)  Resp 16  Ht 5\' 4"  (1.626 m)  Wt 305 lb (138.347 kg)  BMI 52.33 kg/m2  SpO2 98%  LMP 05/29/2015     Assessment &  Plan:  1. Post-viral cough syndrome - chlorpheniramine-HYDROcodone (TUSSIONEX PENNKINETIC ER) 10-8 MG/5ML SUER; Take 5 mLs by mouth every 12 (twelve) hours as needed for cough.  Dispense: 115 mL; Refill: 0 - albuterol (PROVENTIL HFA;VENTOLIN HFA) 108 (90 BASE) MCG/ACT inhaler; Inhale 2 puffs into the lungs every 4 (four) hours as needed for wheezing or shortness of breath (cough, shortness of breath or wheezing.).  Dispense: 1 Inhaler; Refill: 1 - encouraged good hydration, can continue mucinex prn - RTC if no improvement with above measures or if she develops  fever/chills, purulent/bloody sputum, SOB/wheeze  Clarene Reamer, FNP-BC  [facility]  06/27/2015 8:11 AM

## 2015-06-22 NOTE — Patient Instructions (Addendum)
For sinus congestion-  Take sudafed in the morning then another dose in the afternoon Use Afrin spray twice a day for three days  Cough, Adult Coughing is a reflex that clears your throat and your airways. Coughing helps to heal and protect your lungs. It is normal to cough occasionally, but a cough that happens with other symptoms or lasts a long time may be a sign of a condition that needs treatment. A cough may last only 2-3 weeks (acute), or it may last longer than 8 weeks (chronic). CAUSES Coughing is commonly caused by:  Breathing in substances that irritate your lungs.  A viral or bacterial respiratory infection.  Allergies.  Asthma.  Postnasal drip.  Smoking.  Acid backing up from the stomach into the esophagus (gastroesophageal reflux).  Certain medicines.  Chronic lung problems, including COPD (or rarely, lung cancer).  Other medical conditions such as heart failure. HOME CARE INSTRUCTIONS  Pay attention to any changes in your symptoms. Take these actions to help with your discomfort:  Take medicines only as told by your health care provider.  If you were prescribed an antibiotic medicine, take it as told by your health care provider. Do not stop taking the antibiotic even if you start to feel better.  Talk with your health care provider before you take a cough suppressant medicine.  Drink enough fluid to keep your urine clear or pale yellow.  If the air is dry, use a cold steam vaporizer or humidifier in your bedroom or your home to help loosen secretions.  Avoid anything that causes you to cough at work or at home.  If your cough is worse at night, try sleeping in a semi-upright position.  Avoid cigarette smoke. If you smoke, quit smoking. If you need help quitting, ask your health care provider.  Avoid caffeine.  Avoid alcohol.  Rest as needed. SEEK MEDICAL CARE IF:   You have new symptoms.  You cough up pus.  Your cough does not get better after  2-3 weeks, or your cough gets worse.  You cannot control your cough with suppressant medicines and you are losing sleep.  You develop pain that is getting worse or pain that is not controlled with pain medicines.  You have a fever.  You have unexplained weight loss.  You have night sweats. SEEK IMMEDIATE MEDICAL CARE IF:  You cough up blood.  You have difficulty breathing.  Your heartbeat is very fast.   This information is not intended to replace advice given to you by your health care provider. Make sure you discuss any questions you have with your health care provider.   Document Released: 01/05/2011 Document Revised: 03/30/2015 Document Reviewed: 09/15/2014 Elsevier Interactive Patient Education [employer].

## 2015-06-30 ENCOUNTER — Telehealth: Payer: Self-pay

## 2015-06-30 NOTE — Telephone Encounter (Signed)
Called patient and left her a message to return my call. Phone number provided.

## 2015-06-30 NOTE — Telephone Encounter (Signed)
Patient states that she is having dry cough all day and night. She finished all the meds you prescribed and is using her inhaler and the cough syrup at night. No congestion present.

## 2015-06-30 NOTE — Telephone Encounter (Signed)
Pt is still not any better from her visit with debbie and would like to know what to do nect

## 2015-07-01 ENCOUNTER — Other Ambulatory Visit: Payer: Self-pay | Admitting: Family Medicine

## 2015-07-01 DIAGNOSIS — R059 Cough, unspecified: Secondary | ICD-10-CM

## 2015-07-01 DIAGNOSIS — J209 Acute bronchitis, unspecified: Secondary | ICD-10-CM

## 2015-07-01 DIAGNOSIS — R05 Cough: Secondary | ICD-10-CM

## 2015-07-01 MED ORDER — PREDNISONE 20 MG PO TABS: ORAL_TABLET | ORAL | Status: DC

## 2015-07-01 NOTE — Telephone Encounter (Signed)
Called patient on mobile number. No answer. Left her a message again with my call back information.

## 2015-07-01 NOTE — Telephone Encounter (Signed)
Patient returned my call. Continues to cough. Otherwise feels well. Sleeping ok at night. Change of temperature or laughing increases cough. Albuterol inhaler seems to increase cough. She has required prednisone in past for cough. Sent in prescription to her pharmacy. Discussed possible side effects. She will let me know if she is not better in 3 days.

## 2015-08-02 ENCOUNTER — Ambulatory Visit (HOSPITAL_COMMUNITY)
Admission: RE | Admit: 2015-08-02 | Discharge: 2015-08-02 | Disposition: A | Discharge status: Home / Self Care | Payer: BC Managed Care – PPO | Source: Ambulatory Visit | Attending: Internal Medicine | Admitting: Internal Medicine

## 2015-08-02 ENCOUNTER — Other Ambulatory Visit (HOSPITAL_COMMUNITY): Payer: Self-pay | Admitting: Internal Medicine

## 2015-08-02 DIAGNOSIS — R05 Cough: Secondary | ICD-10-CM | POA: Insufficient documentation

## 2015-08-02 DIAGNOSIS — R059 Cough, unspecified: Secondary | ICD-10-CM

## 2015-08-09 ENCOUNTER — Other Ambulatory Visit (HOSPITAL_COMMUNITY): Payer: Self-pay | Admitting: Respiratory Therapy

## 2015-08-09 DIAGNOSIS — R062 Wheezing: Secondary | ICD-10-CM

## 2015-08-10 ENCOUNTER — Ambulatory Visit (HOSPITAL_COMMUNITY)
Admission: RE | Admit: 2015-08-10 | Discharge: 2015-08-10 | Disposition: A | Discharge status: Home / Self Care | Payer: BC Managed Care – PPO | Source: Ambulatory Visit | Attending: Internal Medicine | Admitting: Internal Medicine

## 2015-08-10 DIAGNOSIS — Z0189 Encounter for other specified special examinations: Secondary | ICD-10-CM | POA: Insufficient documentation

## 2015-08-10 DIAGNOSIS — R05 Cough: Secondary | ICD-10-CM | POA: Insufficient documentation

## 2015-08-10 DIAGNOSIS — R062 Wheezing: Secondary | ICD-10-CM | POA: Diagnosis present

## 2015-08-10 LAB — PULMONARY FUNCTION TEST
DL/VA % pred: 98 %
DL/VA: 4.71 ml/min/mmHg/L
DLCO UNC: 24.99 ml/min/mmHg
DLCO unc % pred: 103 %
FEF 25-75 Pre: 4.65 L/sec
FEF2575-%PRED-PRE: 137 %
FEV1-%PRED-PRE: 118 %
FEV1-Pre: 3.75 L
FEV1FVC-%PRED-PRE: 103 %
FEV6-%PRED-PRE: 116 %
FEV6-PRE: 4.32 L
FEV6FVC-%Pred-Pre: 101 %
FVC-%PRED-PRE: 115 %
FVC-PRE: 4.33 L
Pre FEV1/FVC ratio: 87 %
Pre FEV6/FVC Ratio: 100 %
RV % PRED: 100 %
RV: 1.44 L
TLC % pred: 113 %
TLC: 5.72 L

## 2015-09-27 ENCOUNTER — Encounter: Payer: Self-pay | Admitting: Allergy and Immunology

## 2015-09-27 ENCOUNTER — Ambulatory Visit (INDEPENDENT_AMBULATORY_CARE_PROVIDER_SITE_OTHER): Payer: BC Managed Care – PPO | Admitting: Allergy and Immunology

## 2015-09-27 VITALS — BP 108/86 | HR 104 | Temp 99.0°F | Resp 12 | Ht 63.5 in | Wt 313.1 lb

## 2015-09-27 DIAGNOSIS — J309 Allergic rhinitis, unspecified: Secondary | ICD-10-CM

## 2015-09-27 DIAGNOSIS — H1010 Acute atopic conjunctivitis, unspecified eye: Secondary | ICD-10-CM

## 2015-09-27 DIAGNOSIS — R059 Cough, unspecified: Secondary | ICD-10-CM

## 2015-09-27 DIAGNOSIS — R05 Cough: Secondary | ICD-10-CM | POA: Diagnosis not present

## 2015-09-27 MED ORDER — ALBUTEROL SULFATE HFA 108 (90 BASE) MCG/ACT IN AERS: 2.0000 | INHALATION_SPRAY | RESPIRATORY_TRACT | Status: DC | PRN

## 2015-09-27 MED ORDER — LEVOCETIRIZINE DIHYDROCHLORIDE 5 MG PO TABS: 5.0000 mg | ORAL_TABLET | Freq: Every evening | ORAL | Status: DC

## 2015-09-27 MED ORDER — MONTELUKAST SODIUM 10 MG PO TABS: 10.0000 mg | ORAL_TABLET | Freq: Every day | ORAL | Status: DC

## 2015-09-27 MED ORDER — BECLOMETHASONE DIPROPIONATE 80 MCG/ACT IN AERS: 2.0000 | INHALATION_SPRAY | Freq: Two times a day (BID) | RESPIRATORY_TRACT | Status: DC

## 2015-09-27 NOTE — Patient Instructions (Signed)
Take Home Sheet  1. Avoidance: Mite   2. Antihistamine: Xzyal 5mg   by mouth once daily for runny nose or itching.   3. Nasal Spray: Rhinocort AQ 1-2 spray(s) each nostril once daily for stuffy nose or drainage.    4. Inhalers:  Rescue: ProAir 2 puffs every 4 hours as needed for cough or wheeze.       -May use 2 puffs 10-20 minutes prior to exercise.   Preventative: QVAR 76mcg 2 puffs twice daily (Rinse, gargle, and spit out after use).   5.  Other: Continue Singulair 10mg  each evening.   7. Nasal Saline wash each evening at shower time.  8. Follow up Visit: 6 weeks or sooner if needed.   Websites that have reliable Patient information: 1. American Academy of Asthma, Allergy, & Immunology: www.aaaai.org 2. Food Allergy Network: www.foodallergy.org 3. Mothers of Asthmatics: www.aanma.org 4. [place]: DiningCalendar.de 5. American College of Allergy, Asthma, & Immunology: https://robertson.info/ or www.acaai.org  Control of House Dust Mite Allergen  House dust mites play a major role in allergic asthma and rhinitis.  They occur in environments with high humidity wherever human skin, the food for dust mites is found. High levels have been detected in dust obtained from mattresses, pillows, carpets, upholstered furniture, bed covers, clothes and soft toys.  The principal allergen of the house dust mite is found in its feces.  A gram of dust may contain 1,000 mites and 250,000 fecal particles.  Mite antigen is easily measured in the air during house cleaning activities.  1. Encase mattresses, including the box spring, and pillow, in an air tight cover.  Seal the zipper end of the encased mattresses with wide adhesive tape. 2. Wash the bedding in water of 130 degrees Farenheit weekly.  Avoid cotton comforters/quilts and flannel bedding: the most ideal bed covering is the dacron comforter. 3. Remove all upholstered furniture from the bedroom. 4. Remove  carpets, carpet padding, rugs, and non-washable window drapes from the bedroom.  Wash drapes weekly or use plastic window coverings. 5. Remove all non-washable stuffed toys from the bedroom.  Wash stuffed toys weekly. 6. Have the room cleaned frequently with a vacuum cleaner and a damp dust-mop.  The patient should not be in a room which is being cleaned and should wait 1 hour after cleaning before going into the room. 7. Close and seal all heating outlets in the bedroom.  Otherwise, the room will become filled with dust-laden air.  An electric heater can be used to heat the room. 8. Reduce indoor humidity to less than 50%.  Do not use a humidifier.

## 2015-09-27 NOTE — Progress Notes (Signed)
NEW PATIENT NOTE  RE: Brianna Bowen MRN: [id number] DOB: January 17, 1981 [facility] [place] [address] [place], [place] [address] Date of Office Visit: 09/27/2015  Dear Celene Squibb, MD:  I had the pleasure of seeing Brianna Bowen  today in initial evaluation as you recall-- Subjective:  [name] is a 35 y.o. female who presents today for Cough and Wheezing  Assessment:   1. Cough and previous history of wheeze,  Probable component of persistent asthma.   2. Allergic rhinoconjunctivitis, perennial hypersensitivities.    Plan:   Meds ordered this encounter  Medications  . levocetirizine (XYZAL) 5 MG tablet    Sig: Take 1 tablet (5 mg total) by mouth every evening.    Dispense:  30 tablet    Refill:  5  . albuterol (PROAIR HFA) 108 (90 Base) MCG/ACT inhaler    Sig: Inhale 2 puffs into the lungs every 4 (four) hours as needed for wheezing or shortness of breath (cough).    Dispense:  1 Inhaler    Refill:  1  . beclomethasone (QVAR) 80 MCG/ACT inhaler    Sig: Inhale 2 puffs into the lungs 2 (two) times daily.    Dispense:  1 Inhaler    Refill:  3  . montelukast (SINGULAIR) 10 MG tablet    Sig: Take 1 tablet (10 mg total) by mouth at bedtime.    Dispense:  30 tablet    Refill:  5   Patient Instructions  1. Avoidance: Dust Mite 2. Antihistamine: Xzyal 5mg   by mouth once daily for runny nose or itching. 3. Nasal Spray: Rhinocort AQ 1-2 spray(s) each nostril once daily for stuffy nose or drainage.  4. Inhalers:  Rescue: ProAir 2 puffs every 4 hours as needed for cough or wheeze.       -May use 2 puffs 10-20 minutes prior to exercise.  Preventative: Increase to QVAR 48mcg 2 puffs twice daily (Rinse, gargle, and spit out after use). 5.  Other: Continue Singulair 10mg  each evening. 6. Nasal Saline wash each evening at shower time. 7. Follow up Visit: 6 weeks or sooner if needed.  HPI: Brianna Bowen presents with year-round rhinorrhea, congestion, sneezing,  itchy watery eyes, cough and intermittent associated wheeze and shortness of breath.  She recalls bronchitis a few times a year, but greater difficulty this year.  After completing prednisone and antibiotics,  she describes about 75% improvement since recurring symptoms began about October and maintenance medications were begun in January.  She does not recall a diagnosis of asthma, but recalls bronchitis a few times a year and environmental aeroallergen positive testing about age 65 years.  She generally feels pollen, mold, outdoors, fluctuant weather patterns and perfumes are provoking factors for her symptoms though no sinus infections, food sensitivities or reflux.  Denies ED or Urgent care visits.  She did have a chest x-ray at [facility] and normal PFTs in the recent months.  Medical History: Past Medical History  Diagnosis Date  . Pneumonia   . Anxiety   . Cancer ([place])      skin cancer melamona  . Recurrent upper respiratory infection (URI)   . Urticaria     from penicillin   Surgical History: Past Surgical History  Procedure Laterality Date  . Wisdom tooth extraction    . Cholecystectomy N/A 05/31/2015    Procedure: LAPAROSCOPIC CHOLECYSTECTOMY WITH INTRAOPERATIVE CHOLANGIOGRAM;  Surgeon: Jackolyn Confer, MD;  Location: [place];  Service: General;  Laterality: N/A;  Family History: Family History  Problem Relation Age of Onset  . Diabetes Mother   . Hypertension Mother   . Hyperlipidemia Mother   . Allergic rhinitis Mother   . Diabetes Maternal Grandmother   . Cancer Maternal Grandmother     ovarian  . Hyperlipidemia Maternal Grandmother   . Emphysema Maternal Grandfather   . Diabetes Maternal Grandfather   . Heart disease Maternal Grandfather   . Hypertension Maternal Grandfather   . Diabetes Paternal Grandfather   . Heart disease Paternal Grandfather   . Hyperlipidemia Paternal Grandfather   . Hypertension Paternal Grandfather   . Allergic rhinitis Father   . Allergic  rhinitis Sister   . Asthma Neg Hx   . Eczema Neg Hx   . Immunodeficiency Neg Hx   . Urticaria Neg Hx   . Angioedema Neg Hx    Social History: Social History  . Marital Status: Single    Spouse Name: N/A  . Number of Children: N/A  . Years of Education: N/A   Social History Main Topics  . Smoking status: Never Smoker   . Smokeless tobacco: Never Used  . Alcohol Use: 0.6 - 1.2 oz/week    1-2 Standard drinks or equivalent per week     Comment: socially wine - monthly  . Drug Use: No  . 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  Alexsis has a current medication list which includes the following prescription(s): beclomethasone, chlorpheniramine-hydrocodone, fluticasone, gabapentin, montelukast and albuterol.   Drug Allergies: Allergies  Allergen Reactions  . Penicillins Hives   Environmental History: [place] lives in a 32 year old house for 9 years with carpeted floors, with central heat and air; stuffed mattress, non-feather pillow/comforter without humidifier, pets and smokers.   Review of Systems  Constitutional: Negative for fever, weight loss and malaise/fatigue.       Childhood varicella disease.  HENT: Positive for congestion. Negative for ear pain, hearing loss, nosebleeds and sore throat.   Eyes: Negative for discharge and redness.  Respiratory: Negative for shortness of breath.        Denies history of bronchitis and pneumonia. See HPI.  Gastrointestinal: Positive for heartburn (Rare episodes.). Negative for nausea, vomiting, abdominal pain, diarrhea and constipation.  Genitourinary: Negative.   Musculoskeletal: Negative for myalgias and joint pain.  Skin: Negative.  Negative for itching and rash.  Neurological: Negative.  Negative for dizziness, seizures, weakness and headaches.  Endo/Heme/Allergies: Positive for environmental allergies.       Denies sensitivity to aspirin, NSAIDs,  stinging insects, foods, latex, jewelry and cosmetics.  Immunological: No chronic or recurring infections. Objective:   Filed Vitals:   09/27/15 1341  BP: 108/86  Pulse: 104  Temp: 99 F (37.2 C)  Resp: 12   SpO2 Readings from Last 1 Encounters:  09/27/15 97%   Physical Exam  Constitutional: She is well-developed, well-nourished, and in no distress.  HENT:  Head: Atraumatic.  Right Ear: Tympanic membrane and ear canal normal.  Left Ear: Tympanic membrane and ear canal normal.  Nose: Mucosal edema present. No rhinorrhea. No epistaxis.  Mouth/Throat: Oropharynx is clear and moist and mucous membranes are normal. No oropharyngeal exudate, posterior oropharyngeal edema or posterior oropharyngeal erythema.  Eyes: Conjunctivae are normal.  Neck: Neck supple.  Cardiovascular: Normal rate, S1 normal and S2 normal.   No murmur heard. Pulmonary/Chest: Effort normal. She has no wheezes. She has no rhonchi. She  has no rales.  Abdominal: Soft. Normal appearance and bowel sounds are normal.  Musculoskeletal: She exhibits no edema.  Lymphadenopathy:    She has no cervical adenopathy.  Neurological: She is alert.  Skin: Skin is warm and intact. No rash noted. No cyanosis. Nails show no clubbing.   Diagnostics: Spirometry:  FVC  .  FVC 4.3.--124%, FEV1 3.60--122%; essentially no change postbronchodilator.  Skin testing:  Mild reactivity to dust mite mix via intradermal only.    Roselyn M. Ishmael Holter, MD   cc: Wende Neighbors, MD

## 2015-11-29 ENCOUNTER — Ambulatory Visit: Payer: BC Managed Care – PPO | Admitting: Allergy and Immunology

## 2016-01-10 ENCOUNTER — Ambulatory Visit: Payer: BC Managed Care – PPO | Admitting: Allergy and Immunology

## 2016-10-02 ENCOUNTER — Telehealth: Payer: BC Managed Care – PPO | Admitting: Family

## 2016-10-02 DIAGNOSIS — R05 Cough: Secondary | ICD-10-CM

## 2016-10-02 DIAGNOSIS — R059 Cough, unspecified: Secondary | ICD-10-CM

## 2016-10-02 DIAGNOSIS — R062 Wheezing: Secondary | ICD-10-CM

## 2016-10-02 NOTE — Progress Notes (Signed)
Based on what you shared with me it looks like you have a serious condition that should be evaluated in a face to face office visit.  NOTE: Even if you have entered your credit card information for this eVisit, you will not be charged.   If you are having a true medical emergency please call 911.  If you need an urgent face to face visit, [facility] has four urgent care centers for your convenience.  If you need care fast and have a high deductible or no insurance consider:   [email or web address]  [phone]  [address], [address] [place], [place] [address] 8 am to 8 pm Monday-Friday 10 am to 4 pm Saturday-Sunday   The following sites will take your  insurance:    . [facility]  [phone] Get Driving Directions Find a Provider at this Location  [address] [place], [place] [address] . 10 am to 8 pm Monday-Friday . 12 pm to 8 pm Saturday-Sunday   . [facility]  [phone] Get Driving Directions Find a Provider at this Location  [address], [address] [place], [place] [address] . 8 am to 8 pm Monday-Friday . 9 am to 6 pm Saturday . 11 am to 6 pm Sunday   . [facility] [place]  [phone] Get Driving Directions  [address] [place],  [address] . 8 am to 8 pm Monday-Friday . 8 am to 4 pm Saturday-Sunday   Your e-visit answers were reviewed by a board certified advanced clinical practitioner to complete your personal care plan.  Thank you for using e-Visits.  

## 2017-06-14 IMAGING — RF DG CHOLANGIOGRAM OPERATIVE
1 series · 4 of 4 positions shown · non-contrast
Comparison: Abdominal ultrasound on 03/23/2015

CLINICAL DATA: Cholecystectomy for symptomatic cholelithiasis.

EXAM:
INTRAOPERATIVE CHOLANGIOGRAM
TECHNIQUE: Cholangiographic images from the C-arm fluoroscopic device were
submitted for interpretation post-operatively. Please see the
procedural report for the amount of contrast and the fluoroscopy
time utilized.

[Series 1: run · 4 of 63 frames shown]
[frame 8/63]
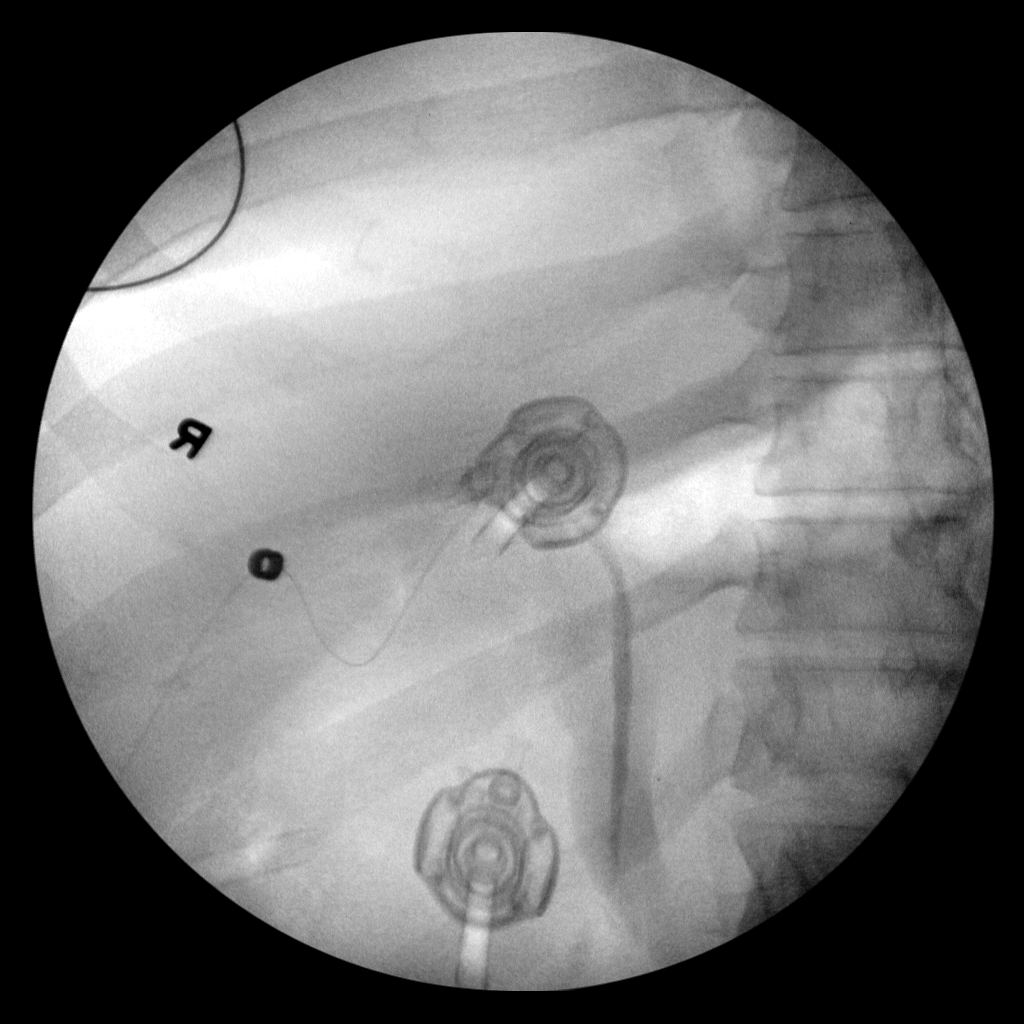
[frame 10/63]
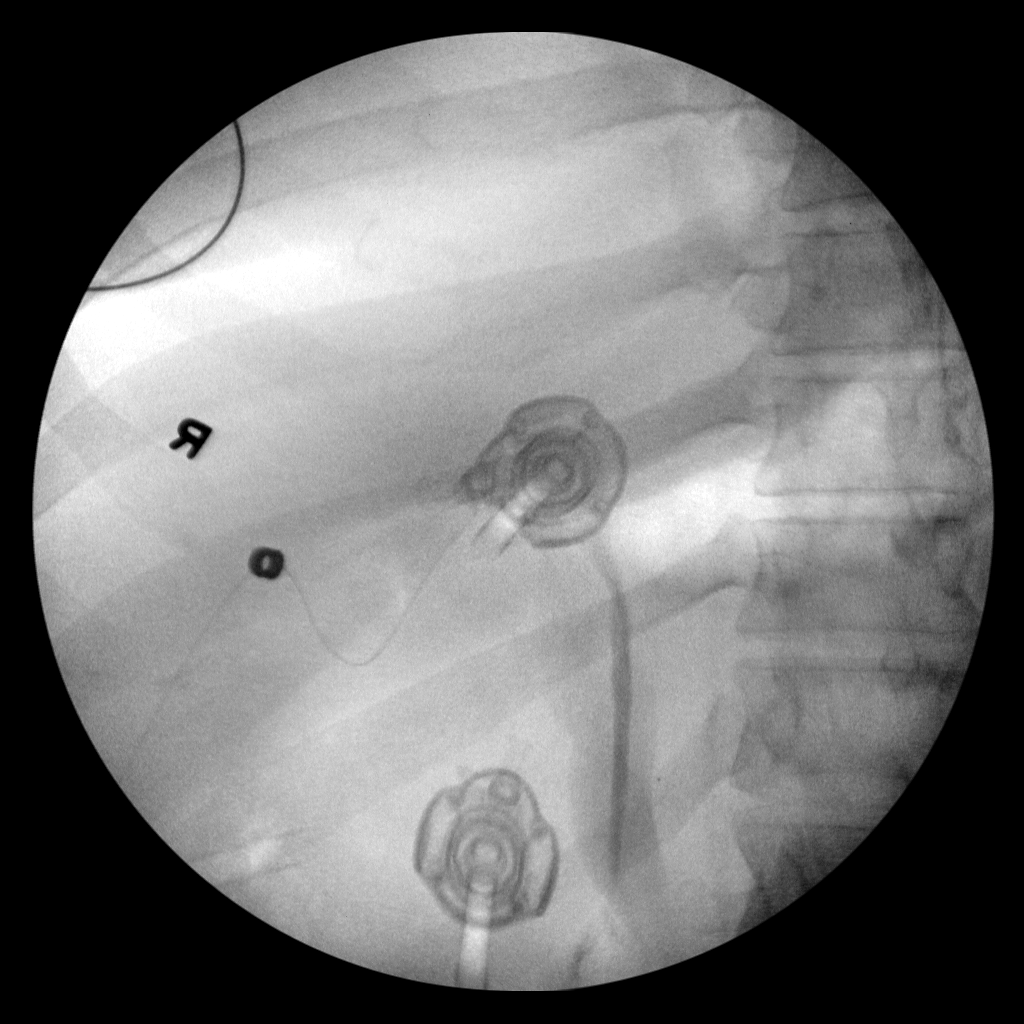
[frame 32/63]
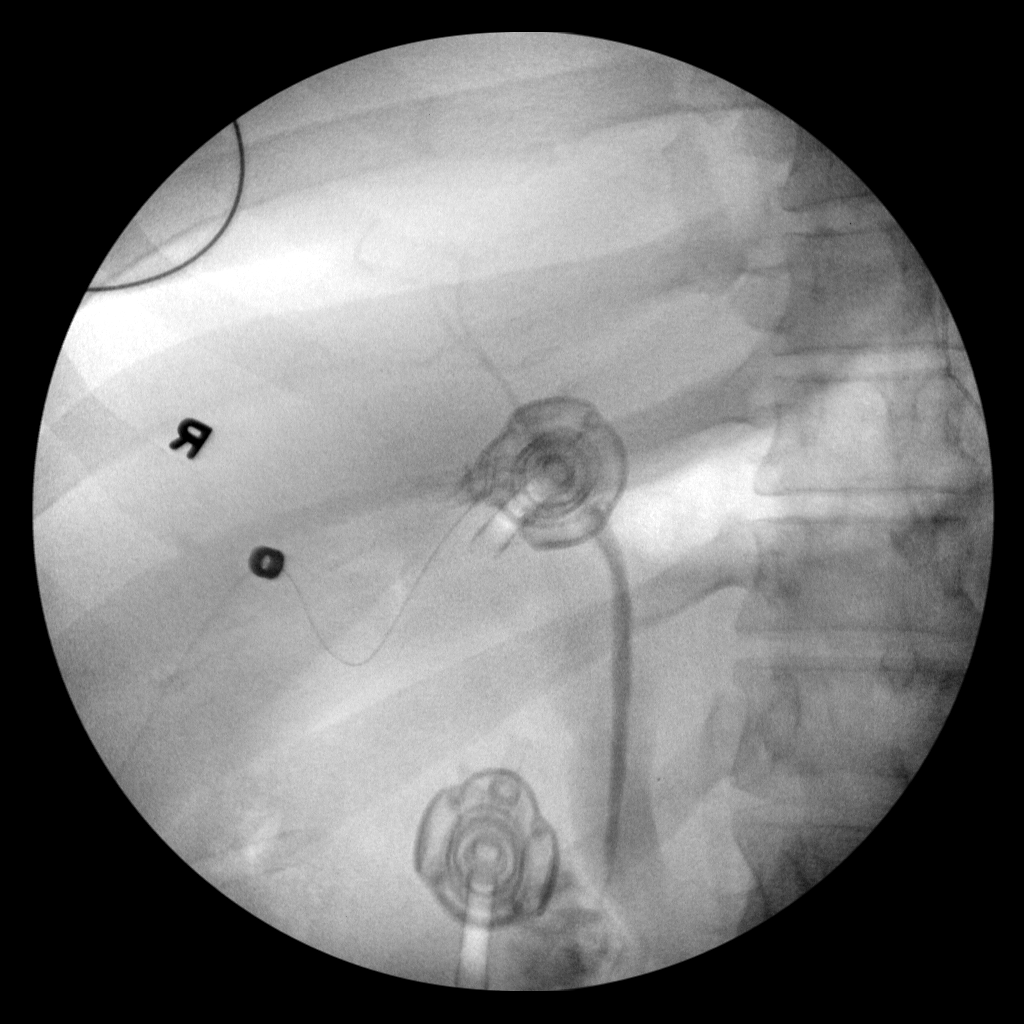
[frame 54/63]
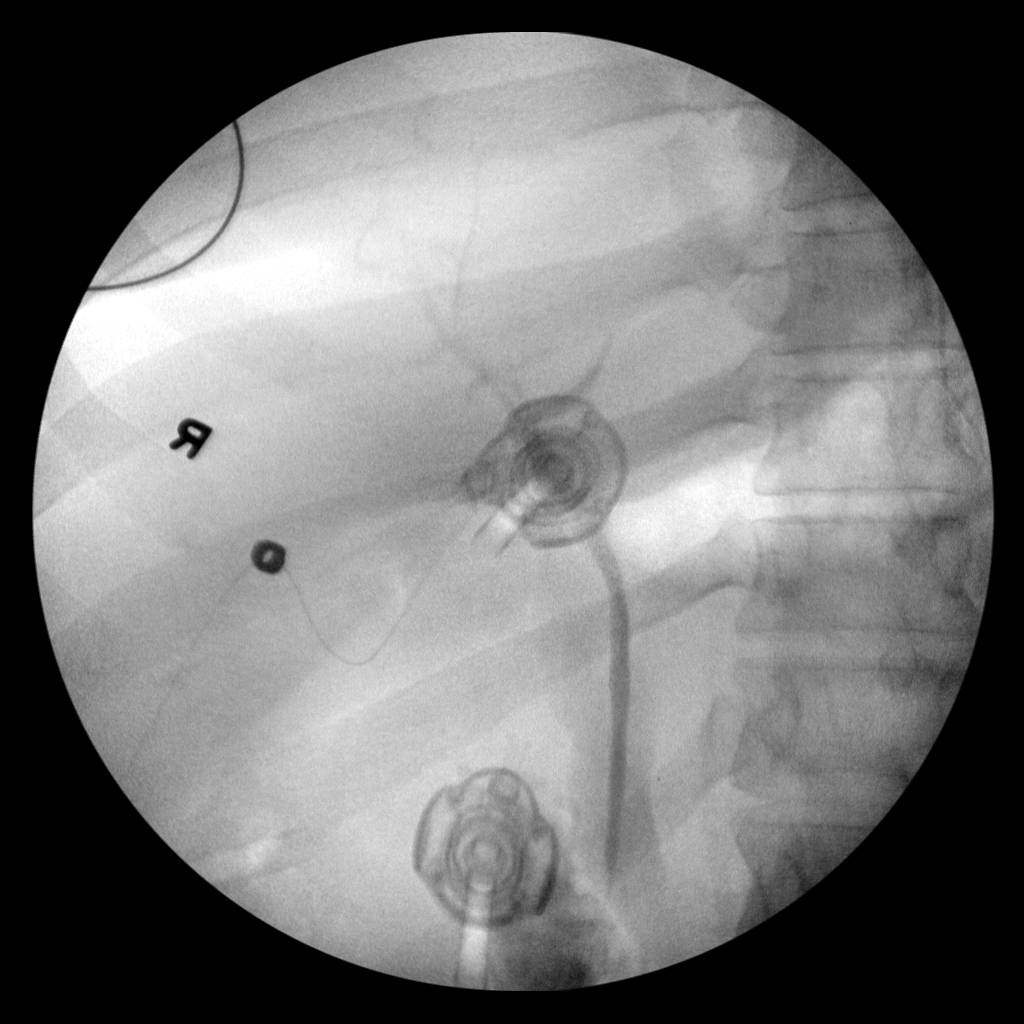

[4 of 4 positions shown; findings below may reference images not displayed]

FINDINGS: Intraoperative images with a C-arm demonstrate a normal opacified
biliary tree without evidence of filling defect, obstruction or
contrast extravasation. Contrast enters the duodenum normally.
IMPRESSION: Normal intraoperative cholangiogram.

## 2017-10-15 ENCOUNTER — Ambulatory Visit: Payer: BC Managed Care – PPO | Admitting: Allergy & Immunology

## 2017-10-15 DIAGNOSIS — J309 Allergic rhinitis, unspecified: Secondary | ICD-10-CM

## 2019-01-21 ENCOUNTER — Other Ambulatory Visit (HOSPITAL_COMMUNITY): Payer: Self-pay | Admitting: Internal Medicine

## 2019-01-21 ENCOUNTER — Other Ambulatory Visit: Payer: Self-pay | Admitting: Internal Medicine

## 2019-01-21 DIAGNOSIS — R1011 Right upper quadrant pain: Secondary | ICD-10-CM

## 2019-01-27 ENCOUNTER — Ambulatory Visit (HOSPITAL_COMMUNITY)
Admission: RE | Admit: 2019-01-27 | Discharge: 2019-01-27 | Disposition: A | Discharge status: Home / Self Care | Payer: BC Managed Care – PPO | Source: Ambulatory Visit | Attending: Internal Medicine | Admitting: Internal Medicine

## 2019-01-27 ENCOUNTER — Other Ambulatory Visit: Payer: Self-pay

## 2019-01-27 DIAGNOSIS — R1011 Right upper quadrant pain: Secondary | ICD-10-CM | POA: Diagnosis present

## 2019-03-05 ENCOUNTER — Other Ambulatory Visit: Payer: Self-pay | Admitting: Internal Medicine

## 2019-03-05 DIAGNOSIS — Z20822 Contact with and (suspected) exposure to covid-19: Secondary | ICD-10-CM

## 2019-03-07 LAB — NOVEL CORONAVIRUS, NAA: SARS-CoV-2, NAA: NOT DETECTED

## 2020-06-03 ENCOUNTER — Encounter: Payer: Self-pay | Admitting: Allergy & Immunology

## 2020-06-03 ENCOUNTER — Ambulatory Visit (INDEPENDENT_AMBULATORY_CARE_PROVIDER_SITE_OTHER): Payer: BC Managed Care – PPO | Admitting: Allergy & Immunology

## 2020-06-03 ENCOUNTER — Other Ambulatory Visit: Payer: Self-pay

## 2020-06-03 VITALS — BP 154/110 | HR 97 | Temp 98.0°F | Resp 18 | Ht 63.0 in | Wt 311.8 lb

## 2020-06-03 DIAGNOSIS — J4540 Moderate persistent asthma, uncomplicated: Secondary | ICD-10-CM | POA: Diagnosis not present

## 2020-06-03 DIAGNOSIS — J3089 Other allergic rhinitis: Secondary | ICD-10-CM

## 2020-06-03 DIAGNOSIS — J302 Other seasonal allergic rhinitis: Secondary | ICD-10-CM

## 2020-06-03 MED ORDER — AZELASTINE HCL 0.1 % NA SOLN: NASAL | 5 refills | Status: DC

## 2020-06-03 MED ORDER — PROAIR DIGIHALER 108 (90 BASE) MCG/ACT IN AEPB: 2.0000 | INHALATION_SPRAY | RESPIRATORY_TRACT | 1 refills | Status: DC | PRN

## 2020-06-03 MED ORDER — AIRDUO DIGIHALER 232-14 MCG/ACT IN AEPB: 1.0000 | INHALATION_SPRAY | Freq: Two times a day (BID) | RESPIRATORY_TRACT | 5 refills | Status: DC

## 2020-06-03 NOTE — Patient Instructions (Addendum)
1. Moderate persistent asthma, uncomplicated - Lung testing actually looked good. - Because of your frequent symptoms, we are going to add on a daily medication: AirDuo  - This contains a long acting albuterol combined with an inhaled steroid. - There is an app you can download with these devices to help you learn how to use it effectively.  - Daily controller medication(s): AirDuo232/14 one puff twice daily - Prior to physical activity: ProAir 2 puffs 10-15 minutes before physical activity. - Rescue medications: ProAir 4 puffs every 4-6 hours as needed - Asthma control goals:  * Full participation in all desired activities (may need albuterol before activity) * Albuterol use two time or less a week on average (not counting use with activity) * Cough interfering with sleep two time or less a month * Oral steroids no more than once a year * No hospitalizations  2. Chronic rhinitis - Testing today showed: grasses, ragweed, weeds, trees, indoor molds, outdoor molds, dust mites, cat, dog and cockroach - Copy of test results provided.  - Avoidance measures provided. - Continue with: Xyzal (levocetirizine) 5mg  tablet once daily - Start taking: Astelin (azelastine) 2 sprays per nostril 1-2 times daily as needed - You can use an extra dose of the antihistamine, if needed, for breakthrough symptoms.  - Consider nasal saline rinses 1-2 times daily to remove allergens from the nasal cavities as well as help with mucous clearance (this is especially helpful to do before the nasal sprays are given) - Consider allergy shots as a means of long-term control. - Allergy shots "re-train" and "reset" the immune system to ignore environmental allergens and decrease the resulting immune response to those allergens (sneezing, itchy watery eyes, runny nose, nasal congestion, etc).    - Allergy shots improve symptoms in 75-85% of patients.  - We can discuss more at the next appointment if the medications are not  working for you.  3. Return in about 4 weeks (around 07/01/2020).    Please inform us of any Emergency Department visits, hospitalizations, or changes in symptoms. Call us before going to the ED for breathing or allergy symptoms since we might be able to fit you in for a sick visit. Feel free to contact us anytime with any questions, problems, or concerns.  It was a pleasure to meet you today!  Websites that have reliable patient information: 1. American Academy of Asthma, Allergy, and Immunology: www.aaaai.org 2. Food Allergy Research and Education (FARE): foodallergy.org 3. Mothers of Asthmatics: http://www.asthmacommunitynetwork.org 4. American College of Allergy, Asthma, and Immunology: www.acaai.org   COVID-19 Vaccine Information can be found at: [email or web address] For questions related to vaccine distribution or appointments, please email [email or web address] or call [phone].     "Like" [name] on Facebook and Instagram for our latest updates!     HAPPY FALL!     Make sure you are registered to vote! If you have moved or changed any of your contact information, you will need to get this updated before voting!  In some cases, you MAY be able to register to vote online: [email or web address]    Reducing Pollen Exposure  The American Academy of Allergy, Asthma and Immunology suggests the following steps to reduce your exposure to pollen during allergy seasons.    1. Do not hang sheets or clothing out to dry; pollen may collect on these items. 2. Do not mow lawns or spend time around freshly cut grass; mowing stirs up pollen. 3. Keep windows closed at night.  Keep car windows closed  while driving. 4. Minimize morning activities outdoors, a time when pollen counts are usually at their highest. 5. Stay indoors as much as possible when pollen counts or humidity is high and on windy days  when pollen tends to remain in the air longer. 6. Use air conditioning when possible.  Many air conditioners have filters that trap the pollen spores. 7. Use a HEPA room air filter to remove pollen form the indoor air you breathe.  Control of Mold Allergen   Mold and fungi can grow on a variety of surfaces provided certain temperature and moisture conditions exist.  Outdoor molds grow on plants, decaying vegetation and soil.  The major outdoor mold, Alternaria and Cladosporium, are found in very high numbers during hot and dry conditions.  Generally, a late Summer - Fall peak is seen for common outdoor fungal spores.  Rain will temporarily lower outdoor mold spore count, but counts rise rapidly when the rainy period ends.  The most important indoor molds are Aspergillus and Penicillium.  Dark, humid and poorly ventilated basements are ideal sites for mold growth.  The next most common sites of mold growth are the bathroom and the kitchen.  Outdoor (Seasonal) Mold Control  Positive outdoor molds via skin testing: Alternaria, Cladosporium, Bipolaris (Helminthsporium), Drechslera (Curvalaria) and Mucor  1. Use air conditioning and keep windows closed 2. Avoid exposure to decaying vegetation. 3. Avoid leaf raking. 4. Avoid grain handling. 5. Consider wearing a face mask if working in moldy areas.  6.   Indoor (Perennial) Mold Control   Positive indoor molds via skin testing: Fusarium, Aureobasidium (Pullulara) and Rhizopus  1. Maintain humidity below 50%. 2. Clean washable surfaces with 5% bleach solution. 3. Remove sources e.g. contaminated carpets.     Control of Dog or Cat Allergen  Avoidance is the best way to manage a dog or cat allergy. If you have a dog or cat and are allergic to dog or cats, consider removing the dog or cat from the home. If you have a dog or cat but don't want to find it a new home, or if your family wants a pet even though someone in the household is allergic,  here are some strategies that may help keep symptoms at bay:  1. Keep the pet out of your bedroom and restrict it to only a few rooms. Be advised that keeping the dog or cat in only one room will not limit the allergens to that room. 2. Don't pet, hug or kiss the dog or cat; if you do, wash your hands with soap and water. 3. High-efficiency particulate air (HEPA) cleaners run continuously in a bedroom or living room can reduce allergen levels over time. 4. Regular use of a high-efficiency vacuum cleaner or a central vacuum can reduce allergen levels. 5. Giving your dog or cat a bath at least once a week can reduce airborne allergen.  Control of Cockroach Allergen  Cockroach allergen has been identified as an important cause of acute attacks of asthma, especially in urban settings.  There are fifty-five species of cockroach that exist in the [place], however only three, the [place], [place] species produce allergen that can affect patients with Asthma.  Allergens can be obtained from fecal particles, egg casings and secretions from cockroaches.    1. Remove food sources. 2. Reduce access to water. 3. Seal access and entry points. 4. Spray runways with 0.5-1% Diazinon or Chlorpyrifos 5. Blow boric acid power under stoves and refrigerator. 6.  Place bait stations (hydramethylnon) at feeding sites.  Allergy Shots   Allergies are the result of a chain reaction that starts in the immune system. Your immune system controls how your body defends itself. For instance, if you have an allergy to pollen, your immune system identifies pollen as an invader or allergen. Your immune system overreacts by producing antibodies called Immunoglobulin E (IgE). These antibodies travel to cells that release chemicals, causing an allergic reaction.  The concept behind allergy immunotherapy, whether it is received in the form of shots or tablets, is that the immune system can be desensitized to  specific allergens that trigger allergy symptoms. Although it requires time and patience, the payback can be long-term relief.  How Do Allergy Shots Work?  Allergy shots work much like a vaccine. Your body responds to injected amounts of a particular allergen given in increasing doses, eventually developing a resistance and tolerance to it. Allergy shots can lead to decreased, minimal or no allergy symptoms.  There generally are two phases: build-up and maintenance. Build-up often ranges from three to six months and involves receiving injections with increasing amounts of the allergens. The shots are typically given once or twice a week, though more rapid build-up schedules are sometimes used.  The maintenance phase begins when the most effective dose is reached. This dose is different for each person, depending on how allergic you are and your response to the build-up injections. Once the maintenance dose is reached, there are longer periods between injections, typically two to four weeks.  Occasionally doctors give cortisone-type shots that can temporarily reduce allergy symptoms. These types of shots are different and should not be confused with allergy immunotherapy shots.  Who Can Be Treated with Allergy Shots?  Allergy shots may be a good treatment approach for people with allergic rhinitis (hay fever), allergic asthma, conjunctivitis (eye allergy) or stinging insect allergy.   Before deciding to begin allergy shots, you should consider:  . The length of allergy season and the severity of your symptoms . Whether medications and/or changes to your environment can control your symptoms . Your desire to avoid long-term medication use . Time: allergy immunotherapy requires a major time commitment . Cost: may vary depending on your insurance coverage  Allergy shots for children age 61 and older are effective and often well tolerated. They might prevent the onset of new allergen  sensitivities or the progression to asthma.  Allergy shots are not started on patients who are pregnant but can be continued on patients who become pregnant while receiving them. In some patients with other medical conditions or who take certain common medications, allergy shots may be of risk. It is important to mention other medications you talk to your allergist.   When Will I Feel Better?  Some may experience decreased allergy symptoms during the build-up phase. For others, it may take as long as 12 months on the maintenance dose. If there is no improvement after a year of maintenance, your allergist will discuss other treatment options with you.  If you aren't responding to allergy shots, it may be because there is not enough dose of the allergen in your vaccine or there are missing allergens that were not identified during your allergy testing. Other reasons could be that there are high levels of the allergen in your environment or major exposure to non-allergic triggers like tobacco smoke.  What Is the Length of Treatment?  Once the maintenance dose is reached, allergy shots are generally continued for  three to five years. The decision to stop should be discussed with your allergist at that time. Some people may experience a permanent reduction of allergy symptoms. Others may relapse and a longer course of allergy shots can be considered.  What Are the Possible Reactions?  The two types of adverse reactions that can occur with allergy shots are local and systemic. Common local reactions include very mild redness and swelling at the injection site, which can happen immediately or several hours after. A systemic reaction, which is less common, affects the entire body or a particular body system. They are usually mild and typically respond quickly to medications. Signs include increased allergy symptoms such as sneezing, a stuffy nose or hives.  Rarely, a serious systemic reaction called  anaphylaxis can develop. Symptoms include swelling in the throat, wheezing, a feeling of tightness in the chest, nausea or dizziness. Most serious systemic reactions develop within 30 minutes of allergy shots. This is why it is strongly recommended you wait in your doctor's office for 30 minutes after your injections. Your allergist is trained to watch for reactions, and his or her staff is trained and equipped with the proper medications to identify and treat them.  Who Should Administer Allergy Shots?  The preferred location for receiving shots is your prescribing allergist's office. Injections can sometimes be given at another facility where the physician and staff are trained to recognize and treat reactions, and have received instructions by your prescribing allergist.

## 2020-06-03 NOTE — Progress Notes (Signed)
NEW PATIENT  Date of Service/Encounter:  06/03/20  Referring provider: Celene Squibb, MD   Assessment:   Moderate persistent asthma, uncomplicated  Seasonal and perennial allergic rhinitis (grasses, ragweed, weeds, trees, indoor molds, outdoor molds, dust mites, cat, dog and cockroach)  Plan/Recommendations:   1. Moderate persistent asthma, uncomplicated - Lung testing actually looked good. - Because of your frequent symptoms, we are going to add on a daily medication: AirDuo  - This contains a long acting albuterol combined with an inhaled steroid. - There is an app you can download with these devices to help you learn how to use it effectively.  - Daily controller medication(s): AirDuo232/14 one puff twice daily - Prior to physical activity: ProAir 2 puffs 10-15 minutes before physical activity. - Rescue medications: ProAir 4 puffs every 4-6 hours as needed - Asthma control goals:  * Full participation in all desired activities (may need albuterol before activity) * Albuterol use two time or less a week on average (not counting use with activity) * Cough interfering with sleep two time or less a month * Oral steroids no more than once a year * No hospitalizations  2. Chronic rhinitis - Testing today showed: grasses, ragweed, weeds, trees, indoor molds, outdoor molds, dust mites, cat, dog and cockroach - Copy of test results provided.  - Avoidance measures provided. - Continue with: Xyzal (levocetirizine) 5mg  tablet once daily - Start taking: Astelin (azelastine) 2 sprays per nostril 1-2 times daily as needed - You can use an extra dose of the antihistamine, if needed, for breakthrough symptoms.  - Consider nasal saline rinses 1-2 times daily to remove allergens from the nasal cavities as well as help with mucous clearance (this is especially helpful to do before the nasal sprays are given) - Consider allergy shots as a means of long-term control. - Allergy shots "re-train"  and "reset" the immune system to ignore environmental allergens and decrease the resulting immune response to those allergens (sneezing, itchy watery eyes, runny nose, nasal congestion, etc).    - Allergy shots improve symptoms in 75-85% of patients.  - We can discuss more at the next appointment if the medications are not working for you.  3. Return in about 4 weeks (around 07/01/2020).    Subjective:   Brianna Bowen is a 39 y.o. female presenting today for evaluation of  Chief Complaint  Patient presents with  . Cough  . Nasal Congestion    Brianna Bowen has a history of the following: Patient Active Problem List   Diagnosis Date Noted  . Symptomatic cholelithiasis 05/30/2015  . Anxiety 05/30/2015  . Obesity, morbid ([place]) 05/30/2015  . Melanoma of skin ([place]) 05/30/2015    History obtained from: chart review and patient.  Brianna Bowen was referred by Celene Squibb, MD.     Brianna Bowen is a 39 y.o. female presenting for an evaluation of shortness of breath and allergies.   Asthma/Respiratory Symptom History: She has never been diagnosed with asthma.  She was told at some point that she had reactive airway disease. She has been given albuterol in the past. Triggers include cold weather, strong smells, the wind, wet and muggy weather. She was on Qvar at some point as well as Singulair and Flonase. The Qvar might have helped but she never felt that the Singulair helped much at all. Her last visit was March 2017.   Allergic Rhinitis Symptom History: She has had allergies since she was a small child. She  gets a sinus infection in the spring and the fall. She typically gets antibiotics and prednisone to get rid of this. She felt that the [place] worked the best of all of the antihistamines. She does not feel that the Singulair has helped at all.  She feels refreshed when she wakes up. She does work outdoors in the [employer] systems [employer]. 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 She was very active in cardio, but the shortness of breath has slowed her down. She has been on quite a bit of weight.  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 She is finally in a position to take care of herself.  Otherwise, there is no history of other atopic diseases, including food allergies, drug allergies, stinging insect allergies, eczema, urticaria or contact dermatitis. There is no significant infectious history. Vaccinations are up to date.    Past Medical History: Patient Active Problem List   Diagnosis Date Noted  . Symptomatic cholelithiasis 05/30/2015  . Anxiety 05/30/2015  . Obesity, morbid ([place]) 05/30/2015  . Melanoma of skin ([place]) 05/30/2015    Birth History: non-contributory  Developmental History: non-contributory  Past Surgical History: Past Surgical History:  Procedure Laterality Date  . CHOLECYSTECTOMY N/A 05/31/2015   Procedure: LAPAROSCOPIC CHOLECYSTECTOMY WITH INTRAOPERATIVE CHOLANGIOGRAM;  Surgeon: Jackolyn Confer, MD;  Location: [facility];  Service: General;  Laterality: N/A;  . WISDOM TOOTH EXTRACTION       Family History: Family History  Problem Relation Age of Onset  . Diabetes Mother   . Hypertension Mother   . Hyperlipidemia Mother   . Allergic rhinitis Mother   . Diabetes Maternal Grandmother   . Cancer Maternal Grandmother        ovarian  . Hyperlipidemia Maternal Grandmother   . Emphysema Maternal Grandfather   . Diabetes Maternal Grandfather   . Heart disease Maternal Grandfather   . Hypertension Maternal Grandfather   . Diabetes Paternal Grandfather   . Heart disease Paternal Grandfather   . Hyperlipidemia Paternal Grandfather   . Hypertension Paternal Grandfather   . Allergic rhinitis Father   . Allergic rhinitis Sister   . Asthma Neg Hx   . Eczema Neg Hx   . Immunodeficiency Neg Hx   . Urticaria Neg Hx   . Angioedema Neg Hx      Social History: Brianna Bowen lives  at home with her family. She lives in a house that is 37 years old. There is carpeting and laminate throughout the home. She is not aware of any mold exposure in her home, but it should be noted that she works in one of the oldest buildings in the [employer]. There is no tobacco exposure at home or at work. She does have dust mite covers on her bed, but not her pillows. 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 She does not use a HEPA filter in the home.  Review of Systems  Constitutional: Negative.  Negative for chills, fever, malaise/fatigue and weight loss.       Positive for weight gain.  HENT: Positive for congestion and sinus pain. Negative for ear discharge and ear pain.   Eyes: Negative for pain, discharge and redness.  Respiratory: Negative for cough, sputum production, shortness of breath and wheezing.   Cardiovascular: Negative.  Negative for chest pain and palpitations.  Gastrointestinal: Negative for abdominal pain, constipation,  diarrhea, heartburn, nausea and vomiting.  Skin: Negative.  Negative for itching and rash.  Neurological: Negative for dizziness and headaches.  Endo/Heme/Allergies: Positive for environmental allergies. Does not bruise/bleed easily.       Objective:   Blood pressure (!) 154/110, pulse 97, temperature 98 F (36.7 C), temperature source Temporal, resp. rate 18, height 5\' 3"  (1.6 m), weight (!) 311 lb 12.8 oz (141.4 kg), SpO2 95 %. Body mass index is 55.23 kg/m.   Physical Exam:   Physical Exam Constitutional:      Appearance: She is well-developed. She is obese.     Comments: Smiling. Personable.  HENT:     Head: Normocephalic and atraumatic.     Right Ear: Tympanic membrane, ear canal and external ear normal. No drainage, swelling or tenderness. Tympanic membrane is not injected, scarred, erythematous, retracted or bulging.     Left Ear: Tympanic membrane, ear canal and external ear normal.  No drainage, swelling or tenderness. Tympanic membrane is not injected, scarred, erythematous, retracted or bulging.     Nose: No nasal deformity, septal deviation, mucosal edema or rhinorrhea.     Right Turbinates: Enlarged, swollen and pale.     Left Turbinates: Enlarged, swollen and pale.     Right Sinus: No maxillary sinus tenderness or frontal sinus tenderness.     Left Sinus: No maxillary sinus tenderness or frontal sinus tenderness.     Mouth/Throat:     Mouth: Mucous membranes are moist. Mucous membranes are not pale and not dry.     Tongue: No lesions. Tongue does not deviate from midline.     Pharynx: Uvula midline.     Comments: Marked cobblestoning. Tonsils unremarkable. Eyes:     General: Allergic shiner present.        Right eye: No discharge.        Left eye: No discharge.     Conjunctiva/sclera: Conjunctivae normal.     Right eye: Right conjunctiva is not injected. No chemosis.    Left eye: Left conjunctiva is not injected. No chemosis.    Pupils: Pupils are equal, round, and reactive to light.  Cardiovascular:     Rate and Rhythm: Normal rate and regular rhythm.     Heart sounds: Normal heart sounds.  Pulmonary:     Effort: Pulmonary effort is normal. No tachypnea, accessory muscle usage or respiratory distress.     Breath sounds: Normal breath sounds. No wheezing, rhonchi or rales.     Comments: Somewhat decreased air movement at the bases, likely due to body habitus. Chest:     Chest wall: No tenderness.  Abdominal:     Tenderness: There is no abdominal tenderness. There is no guarding or rebound.  Lymphadenopathy:     Head:     Right side of head: No submandibular, tonsillar or occipital adenopathy.     Left side of head: No submandibular, tonsillar or occipital adenopathy.     Cervical: No cervical adenopathy.  Skin:    Coloration: Skin is not pale.     Findings: No abrasion, erythema, petechiae or rash. Rash is not papular, urticarial or vesicular.    Neurological:     Mental Status: She is alert.      Diagnostic studies:    Spirometry: results normal (FEV1: 3.45/116%, FVC: 4.15/115%, FEV1/FVC: 83%).    Spirometry consistent with normal pattern.   Allergy Studies:     Airborne Adult Perc - 06/03/20 1437    Time Antigen Placed 1437    Allergen  Manufacturer Greer    Location Back    Number of Test 59    Panel 1 Select    1. Control-Buffer 50% Glycerol Negative    2. Control-Histamine 1 mg/ml 2+    3. Albumin saline Negative    4. [place] Negative    5. [place] Negative    6. Johnson Negative    7. Yucca Blue Negative    8. Meadow Fescue Negative    9. Perennial Rye Negative    10. Sweet Vernal Negative    11. Timothy Negative    12. Cocklebur Negative    13. Burweed Marshelder Negative    14. Ragweed, short Negative    15. Ragweed, Giant Negative    16. Plantain,  English Negative    17. Lamb's Quarters Negative    18. Sheep Sorrell Negative    19. Rough Pigweed Negative    20. Marsh Elder, Rough Negative    21. Mugwort, Common Negative    22. Ash mix Negative    23. Birch mix Negative    24. Beech American Negative    25. Box, Elder Negative    26. Cedar, red Negative    27. Cottonwood, [place] Negative    28. Elm mix Negative    29. Hickory Negative    30. Maple mix Negative    31. Oak, [place] mix Negative    32. Pecan Pollen Negative    33. Pine mix Negative    34. Sycamore Eastern Negative    35. Chauncey, Black Pollen Negative    36. Alternaria alternata Negative    37. Cladosporium Herbarum Negative    38. Aspergillus mix Negative    39. Penicillium mix Negative    40. Bipolaris sorokiniana (Helminthosporium) Negative    41. Drechslera spicifera (Curvularia) Negative    42. Mucor plumbeus Negative    43. Fusarium moniliforme Negative    44. Aureobasidium pullulans (pullulara) Negative    45. Rhizopus oryzae Negative    46. Botrytis cinera Negative    47. Epicoccum nigrum Negative    48. Phoma  betae Negative    49. Candida Albicans Negative    50. Trichophyton mentagrophytes Negative    51. Mite, D Farinae  5,000 AU/ml Negative    52. Mite, D Pteronyssinus  5,000 AU/ml Negative    53. Cat Hair 10,000 BAU/ml Negative    54.  Dog Epithelia Negative    55. Mixed Feathers Negative    56. Horse Epithelia Negative    57. Cockroach, German Negative    58. Mouse Negative    59. Tobacco Leaf Negative          Intradermal - 06/03/20 1518    Time Antigen Placed 1518    Allergen Manufacturer Lavella Hammock    Location Arm    Number of Test 15    Intradermal Select    Control Negative    Guatemala Negative    Johnson Negative    7 Grass 1+    Ragweed mix 3+    Weed mix 3+    Tree mix 3+    Mold 1 2+    Mold 2 Negative    Mold 3 2+    Mold 4 2+    Cat 3+    Dog 3+    Cockroach 4+    Mite mix 2+           Allergy testing results were read and interpreted by myself, documented by clinical staff.  Sirius Woodford, MD [facility]      

## 2020-06-06 ENCOUNTER — Telehealth: Payer: Self-pay | Admitting: *Deleted

## 2020-06-06 NOTE — Telephone Encounter (Signed)
Received PA request from pharmacy via fax for AirDuo Respiclick [id number]. insurnace prefers Brand Advair, Breo, Symbicort. Please advise.   PA key: [id number]

## 2020-06-07 NOTE — Telephone Encounter (Signed)
I think we should be able to get the pharmacy to run the card even without the PA. Can we call the Rep (Amy) and she can call the pharmacy to walk them through it?   Thanks!  Salvatore Marvel, MD [facility]

## 2020-06-08 NOTE — Telephone Encounter (Signed)
Called and left a voicemail for the patient and asked to return call. Wanted to see if she has been to the pharmacy yet to pick up the inhaler first and if she had any issues.

## 2020-06-15 MED ORDER — LEVOCETIRIZINE DIHYDROCHLORIDE 5 MG PO TABS: 5.0000 mg | ORAL_TABLET | Freq: Every evening | ORAL | 5 refills | Status: DC

## 2020-06-15 NOTE — Telephone Encounter (Signed)
Patient called back and stated she did not have any issues with getting her inhaler. She did say she needed a refill for Xyzal. Refills have been sent in.

## 2020-06-15 NOTE — Addendum Note (Signed)
Addended by: Herbie Drape on: 06/15/2020 11:07 AM   Modules accepted: Orders

## 2020-06-28 NOTE — Patient Instructions (Addendum)
Moderate persistent asthma Continue Airduo 232/14 mcg 1 puff twice a day to help prevent cough and wheeze May use albuterol 2 puffs every 4 hours as needed for cough, wheeze, tightness in chest, shortness of breath.  Also may use albuterol 2 puffs 5 to 15 minutes prior to exercise. Asthma control goals:   Full participation in all desired activities (may need albuterol before activity)  Albuterol use two time or less a week on average (not counting use with activity)  Cough interfering with sleep two time or less a month  Oral steroids no more than once a year  No hospitalizations  Seasonal and perennial allergic rhinitis(grass, ragweed, weeds, trees, molds, dust mite, cat, dog, cockroach) Continue Xyzal ( levocetirizine) 5 mg once a day as needed for runny nose or itchy Continue Astelin (azelastine) nasal spray using  2 sprays each nostril 1-2 times a day as needed for runny nose or drainage. In the right nostril, point the applicator out toward the right ear. In the left nostril, point the applicator out toward the left ear Use saline nasal rinse as needed for nasal symptoms.  Use this prior to any medicated nasal sprays.  Please let us know if this treatment plan is not working well for you. Schedule a follow-up appointment in 3 months

## 2020-06-29 ENCOUNTER — Other Ambulatory Visit: Payer: Self-pay

## 2020-06-29 ENCOUNTER — Ambulatory Visit (INDEPENDENT_AMBULATORY_CARE_PROVIDER_SITE_OTHER): Payer: BC Managed Care – PPO | Admitting: Family

## 2020-06-29 ENCOUNTER — Encounter: Payer: Self-pay | Admitting: Family

## 2020-06-29 VITALS — BP 140/78 | HR 80 | Temp 98.7°F | Resp 18 | Ht 64.57 in | Wt 312.8 lb

## 2020-06-29 DIAGNOSIS — J4540 Moderate persistent asthma, uncomplicated: Secondary | ICD-10-CM

## 2020-06-29 DIAGNOSIS — J302 Other seasonal allergic rhinitis: Secondary | ICD-10-CM | POA: Diagnosis not present

## 2020-06-29 DIAGNOSIS — J3089 Other allergic rhinitis: Secondary | ICD-10-CM | POA: Diagnosis not present

## 2020-06-29 NOTE — Progress Notes (Signed)
[place], [place] [address] Dept: [phone]  FOLLOW UP NOTE  Patient ID: Brianna Bowen, female    DOB: April 20, 1981  Age: 39 y.o. MRN: [id number] Date of Office Visit: 06/29/2020  Assessment  Chief Complaint: Follow-up  HPI Brianna Bowen is a 39 year old female who presents today for follow-up of moderate persistent asthma and seasonal and perennial allergic rhinitis.  She was last seen on June 03, 2020 by Dr. Ernst Bowler.  Moderate persistent asthma is reported as moderately controlled with Airduo 232 mcg 1 puff twice a day and albuterol as needed.  She feels that her medicines have worked really well for her asthma.  She reports occasional dry cough due to cold weather and wind.  She also reports little bit of wheezing.  She denies any tightness in her chest, shortness of breath, and nocturnal awakenings.  Since her last office visit she has not required any systemic steroids or made any trips to the emergency room or urgent care due to breathing problems.  She has not had to use her albuterol inhaler since her last office visit.  Seasonal and perennial allergic rhinitis is reported as moderately controlled with Xyzal 5 mg once a day and azelastine nasal spray as needed.  She reports a lot of postnasal drip and occasional nasal congestion.  She denies rhinorrhea and sinus tenderness.  When she first started off using the azelastine nasal spray she was using it once a day, but it caused her nostrils to be raw.  Instructed the patient on proper technique of nasal spray and to contact us if this does not help.   Drug Allergies:  Allergies  Allergen Reactions  . Penicillins Hives    Review of Systems: Review of Systems  Constitutional: Negative for chills and fever.  HENT:       Reports a lot of post nasal drip and occasional nasal congestion at night. Denies rhinorrhea and sinus tenderness  Eyes:       Reports occasional itchy watery eyes- does not  want any medication at this time  Respiratory: Positive for cough and wheezing. Negative for shortness of breath.        Reports occasional dry cough and a little bit of wheezing. Denies tightness in chest and shortness of breath  Cardiovascular: Negative for palpitations.  Gastrointestinal: Negative for abdominal pain and heartburn.  Genitourinary: Negative for dysuria.  Skin: Negative for itching and rash.  Neurological: Negative for headaches.  Endo/Heme/Allergies: Positive for environmental allergies.    Physical Exam: BP 140/78 (BP Location: Left Arm, Patient Position: Sitting, Cuff Size: Large)   Pulse 80   Temp 98.7 F (37.1 C) (Temporal)   Resp 18   Ht 5' 4.57" (1.64 m)   Wt (!) 312 lb 12.8 oz (141.9 kg)   SpO2 95%   BMI 52.75 kg/m    Physical Exam Constitutional:      Appearance: Normal appearance.  HENT:     Head: Normocephalic and atraumatic.     Comments: Pharynx slightly erythematous. Eyes normal. Ears- unable to see bilateral tympanic membranes due to cerumen.  Nose :Bilateral turbinates slightly erythematous and edematous    Right Ear: Ear canal and external ear normal.     Left Ear: Ear canal and external ear normal.     Mouth/Throat:     Pharynx: Oropharynx is clear.  Eyes:     Conjunctiva/sclera: Conjunctivae normal.  Cardiovascular:     Rate and Rhythm: Regular rhythm.  Heart sounds: Normal heart sounds.  Pulmonary:     Effort: Pulmonary effort is normal.     Breath sounds: Normal breath sounds.     Comments: Lungs clear to auscultation Musculoskeletal:     Cervical back: Neck supple.  Skin:    General: Skin is warm.  Neurological:     Mental Status: She is alert and oriented to person, place, and time.  Psychiatric:        Mood and Affect: Mood normal.        Behavior: Behavior normal.        Thought Content: Thought content normal.        Judgment: Judgment normal.     Diagnostics: FVC 4.01 L, FEV1 3.42 L.  Predicted FVC 3.61 L, FEV1  2.98 L.  Spirometry indicates normal ventilatory function.  Assessment and Plan: 1. Moderate persistent asthma, uncomplicated   2. Seasonal and perennial allergic rhinitis     No orders of the defined types were placed in this encounter.   Patient Instructions  Moderate persistent asthma Continue Airduo 232/14 mcg 1 puff twice a day to help prevent cough and wheeze May use albuterol 2 puffs every 4 hours as needed for cough, wheeze, tightness in chest, shortness of breath.  Also may use albuterol 2 puffs 5 to 15 minutes prior to exercise. Asthma control goals:   Full participation in all desired activities (may need albuterol before activity)  Albuterol use two time or less a week on average (not counting use with activity)  Cough interfering with sleep two time or less a month  Oral steroids no more than once a year  No hospitalizations  Seasonal and perennial allergic rhinitis(grass, ragweed, weeds, trees, molds, dust mite, cat, dog, cockroach) Continue Xyzal ( levocetirizine) 5 mg once a day as needed for runny nose or itchy Continue Astelin (azelastine) nasal spray using  2 sprays each nostril 1-2 times a day as needed for runny nose or drainage. In the right nostril, point the applicator out toward the right ear. In the left nostril, point the applicator out toward the left ear Use saline nasal rinse as needed for nasal symptoms.  Use this prior to any medicated nasal sprays.  Please let us know if this treatment plan is not working well for you. Schedule a follow-up appointment in 3 months   Return in about 3 months (around 09/27/2020), or if symptoms worsen or fail to improve.    Thank you for the opportunity to care for this patient.  Please do not hesitate to contact me with questions.  Althea Charon, FNP [facility]

## 2020-09-26 NOTE — Patient Instructions (Addendum)
Moderate persistent asthma For asthma flares/upper respiratory tract infections start Airduo 232/14 mcg 1 puff twice a day until symptoms return to baseline May use albuterol 2 puffs every 4 hours as needed for cough, wheeze, tightness in chest, shortness of breath.  Also may use albuterol 2 puffs 5 to 15 minutes prior to exercise. Asthma control goals:   Full participation in all desired activities (may need albuterol before activity)  Albuterol use two time or less a week on average (not counting use with activity)  Cough interfering with sleep two time or less a month  Oral steroids no more than once a year  No hospitalizations  Seasonal and perennial allergic rhinitis(grass, ragweed, weeds, trees, molds, dust mite, cat, dog, cockroach) Continue Xyzal ( levocetirizine) 5 mg once a day as needed for runny nose or itchy Continue Astelin (azelastine) nasal spray using  2 sprays each nostril 1-2 times a day as needed for runny nose or drainage.  Use saline nasal rinse as needed for nasal symptoms.  Use this prior to any medicated nasal sprays.  Please let us know if this treatment plan is not working well for you. Schedule a follow-up appointment in 3 months

## 2020-09-28 ENCOUNTER — Other Ambulatory Visit: Payer: Self-pay

## 2020-09-28 ENCOUNTER — Encounter: Payer: Self-pay | Admitting: Family

## 2020-09-28 ENCOUNTER — Ambulatory Visit (INDEPENDENT_AMBULATORY_CARE_PROVIDER_SITE_OTHER): Payer: BC Managed Care – PPO | Admitting: Family

## 2020-09-28 VITALS — BP 132/78 | HR 87 | Temp 98.2°F | Resp 18 | Ht 65.16 in | Wt 306.8 lb

## 2020-09-28 DIAGNOSIS — J302 Other seasonal allergic rhinitis: Secondary | ICD-10-CM

## 2020-09-28 DIAGNOSIS — J4540 Moderate persistent asthma, uncomplicated: Secondary | ICD-10-CM

## 2020-09-28 DIAGNOSIS — J3089 Other allergic rhinitis: Secondary | ICD-10-CM | POA: Diagnosis not present

## 2020-09-28 NOTE — Progress Notes (Signed)
[place], [place] [address] Dept: [phone]  FOLLOW UP NOTE  Patient ID: Brianna Bowen, female    DOB: 07/08/1981  Age: 40 y.o. MRN: [id number] Date of Office Visit: 09/28/2020  Assessment  Chief Complaint: Asthma  HPI Brianna Bowen is a 40 year old female who presents today for follow-up of moderate persistent asthma and seasonal and perennial allergic rhinitis.  She was last seen June 29, 2020 by Althea Charon, FNP.  Moderate persistent asthma is reported as doing well with AirDuo 232/14 mcg 1 puff twice a day as needed and albuterol as needed.  She reports that she has just been using her AirDuo as needed and has had to use it 2 times in the past 4 weeks.  She has noticed that her asthma will flare up when the weather is damp or windy.  She denies any coughing, wheezing, tightness in her chest, shortness of breath, and nocturnal awakenings due to breathing problems.  Since her last office visit she has not required any systemic steroids or made any trips to the emergency room or urgent care due to breathing problems.  She has not had to use her albuterol inhaler any since her last office visit.  Seasonal and perennial allergic rhinitis is reported as moderately controlled with Astelin nasal spray as needed and Xyzal 5 mg once a day as needed.  She reports nasal congestion in the morning that goes away for the rest today and occasional rhinorrhea.  She denies any postnasal drip and has not had any sinus infections since her last office visit. She has occasional itchy watery eyes, but they are not bad enough to where she needs a medication at this time.   Drug Allergies:  Penicillins  Review of Systems: Review of Systems  Constitutional: Negative for chills and fever.  HENT:       Reports nasal congestion in the morning and then it clears up. Also, reports occasional rhinorrhea and denies post nasal drip  Eyes:       Reports occasional itchy watery  eyes  Respiratory: Negative for cough, shortness of breath and wheezing.   Cardiovascular: Negative for chest pain and palpitations.  Gastrointestinal: Negative for heartburn.  Genitourinary: Negative for dysuria.  Skin: Negative for itching and rash.  Neurological: Negative for headaches.  Endo/Heme/Allergies: Positive for environmental allergies.    Physical Exam: BP 132/78 (BP Location: Left Arm, Patient Position: Sitting, Cuff Size: Large)   Pulse 87   Temp 98.2 F (36.8 C) (Temporal)   Resp 18   Ht 5' 5.16" (1.655 m)   Wt (!) 306 lb 12.8 oz (139.2 kg)   SpO2 96%   BMI 50.81 kg/m    Physical Exam Constitutional:      Appearance: Normal appearance.  HENT:     Head: Normocephalic and atraumatic.     Comments: Pharynx normal, eyes normal, ears: Unable to visualize right tympanic membrane due to cerumen.  Left ear normal.  Nose: Bilateral lower turbinate mildly edematous and slightly erythematous with no drainage noted    Right Ear: Ear canal and external ear normal.     Left Ear: Tympanic membrane, ear canal and external ear normal.     Mouth/Throat:     Mouth: Mucous membranes are moist.     Pharynx: Oropharynx is clear.  Eyes:     Conjunctiva/sclera: Conjunctivae normal.  Cardiovascular:     Rate and Rhythm: Regular rhythm.     Heart sounds: Normal heart sounds.  Pulmonary:     Effort: Pulmonary effort is normal.     Breath sounds: Normal breath sounds.     Comments: Lungs clear to auscultation Musculoskeletal:     Cervical back: Neck supple.  Skin:    General: Skin is warm.  Neurological:     Mental Status: She is alert and oriented to person, place, and time.  Psychiatric:        Mood and Affect: Mood normal.        Behavior: Behavior normal.        Thought Content: Thought content normal.        Judgment: Judgment normal.     Diagnostics: FVC 3.59 L, FEV1 3.13 L.  Predicted FVC 3.85 L, FEV1 3.16 L.  Spirometry indicates normal ventilatory  function.  Assessment and Plan: 1. Moderate persistent asthma, uncomplicated   2. Seasonal and perennial allergic rhinitis     No orders of the defined types were placed in this encounter.   Patient Instructions  Moderate persistent asthma For asthma flares/upper respiratory tract infections start Airduo 232/14 mcg 1 puff twice a day until symptoms return to baseline May use albuterol 2 puffs every 4 hours as needed for cough, wheeze, tightness in chest, shortness of breath.  Also may use albuterol 2 puffs 5 to 15 minutes prior to exercise. Asthma control goals:   Full participation in all desired activities (may need albuterol before activity)  Albuterol use two time or less a week on average (not counting use with activity)  Cough interfering with sleep two time or less a month  Oral steroids no more than once a year  No hospitalizations  Seasonal and perennial allergic rhinitis(grass, ragweed, weeds, trees, molds, dust mite, cat, dog, cockroach) Continue Xyzal ( levocetirizine) 5 mg once a day as needed for runny nose or itchy Continue Astelin (azelastine) nasal spray using  2 sprays each nostril 1-2 times a day as needed for runny nose or drainage.  Use saline nasal rinse as needed for nasal symptoms.  Use this prior to any medicated nasal sprays.  Please let us know if this treatment plan is not working well for you. Schedule a follow-up appointment in 3 months   Return in about 3 months (around 12/29/2020), or if symptoms worsen or fail to improve.    Thank you for the opportunity to care for this patient.  Please do not hesitate to contact me with questions.  Althea Charon, FNP [facility]

## 2021-01-04 ENCOUNTER — Ambulatory Visit: Payer: BC Managed Care – PPO | Admitting: Family

## 2021-01-25 ENCOUNTER — Telehealth: Payer: Self-pay

## 2021-01-25 MED ORDER — AIRDUO DIGIHALER 232-14 MCG/ACT IN AEPB: 1.0000 | INHALATION_SPRAY | Freq: Two times a day (BID) | RESPIRATORY_TRACT | 0 refills | Status: DC

## 2021-01-25 NOTE — Telephone Encounter (Signed)
Spoke with patient, informed patient that a refill has been sent in to the requested pharmacy. Patient was also informed to keep her appointment on 02/22/2021 for further refills.

## 2021-01-25 NOTE — Telephone Encounter (Signed)
Patient was last seen on 09/28/20 and she was unable to come back in June for her follow up. She is scheduled for 02/22/21 to see Dr Ernst Bowler for a follow up visit. She is wondering if she could have a refill on her Air Duo until then.  Please Advise   [employer]

## 2021-01-26 NOTE — Telephone Encounter (Signed)
Pa submitted thru cover my meds for airduo if it is denied, symbiocrt, breo and advair are preferred.

## 2021-01-27 ENCOUNTER — Other Ambulatory Visit: Payer: Self-pay | Admitting: *Deleted

## 2021-01-27 MED ORDER — AIRDUO DIGIHALER 232-14 MCG/ACT IN AEPB: 1.0000 | INHALATION_SPRAY | Freq: Two times a day (BID) | RESPIRATORY_TRACT | 0 refills | Status: DC

## 2021-02-10 IMAGING — CT CT ABDOMEN WITHOUT CONTRAST
3 of 4 series · 15 of 46 positions shown, 17 images · non-contrast
Comparison: None.

CLINICAL DATA: Right upper quadrant pain for 1 year, worse in the
last 3 months, history of cholecystectomy, melanoma

EXAM:
CT ABDOMEN WITHOUT CONTRAST
TECHNIQUE: Multidetector CT imaging of the abdomen was performed following the
standard protocol without IV contrast. Oral enteric contrast was
administered.

[Series 2: axial st · axial · 0.95mm/px · z∈[-160,+90]mm · 11 of 61 slices shown, 13 images]
[im 6/61  soft-tissue]
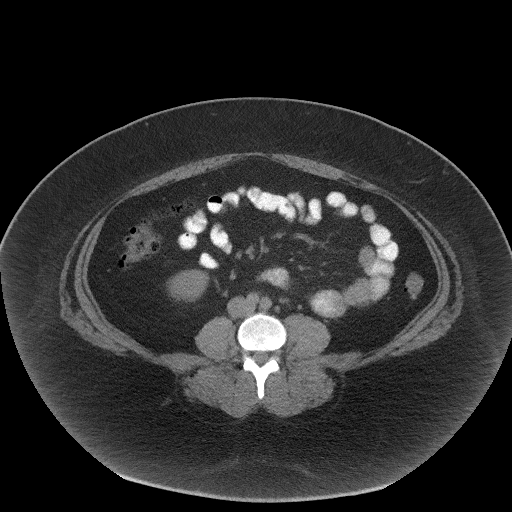
[im 6/61  bone]
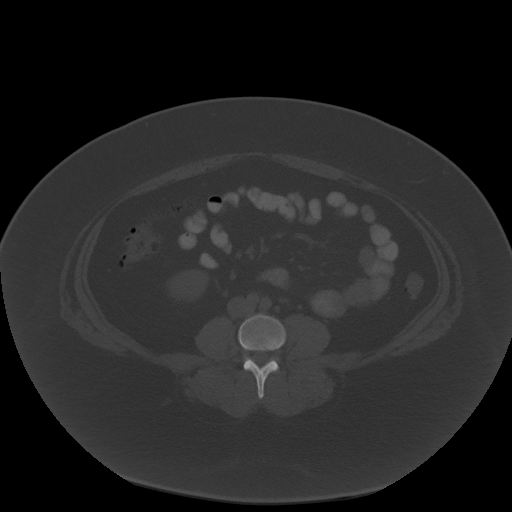
[im 11/61  soft-tissue]
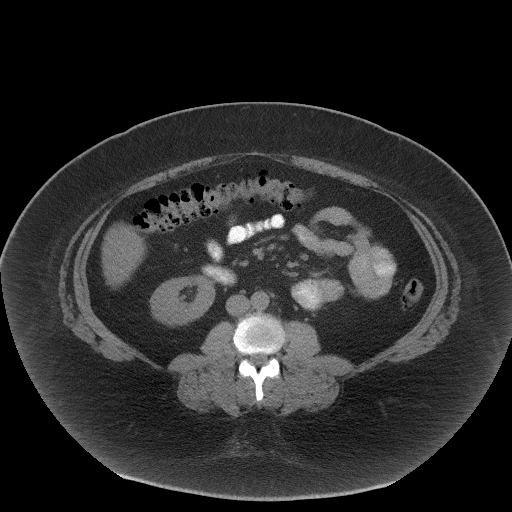
[im 16/61  soft-tissue]
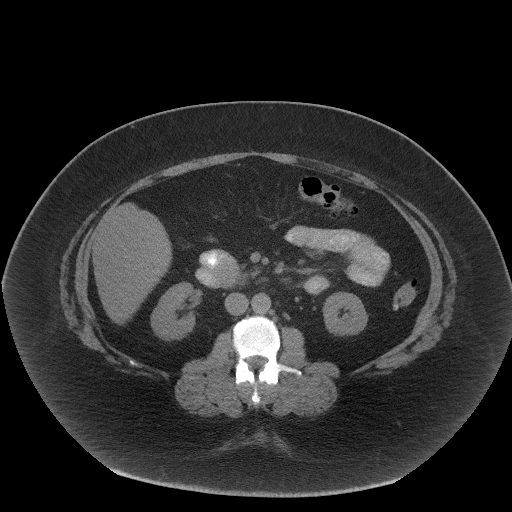
[im 21/61  soft-tissue]
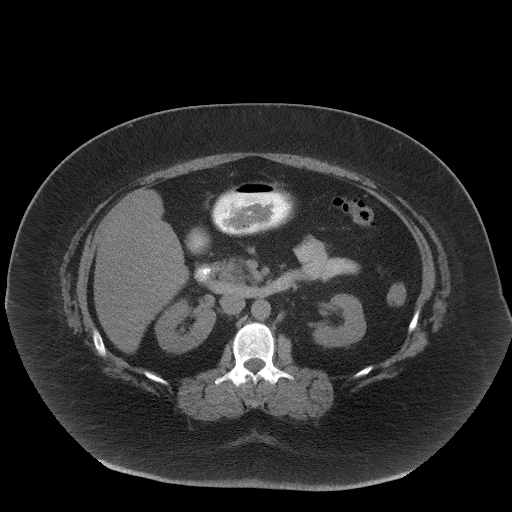
[im 26/61  soft-tissue]
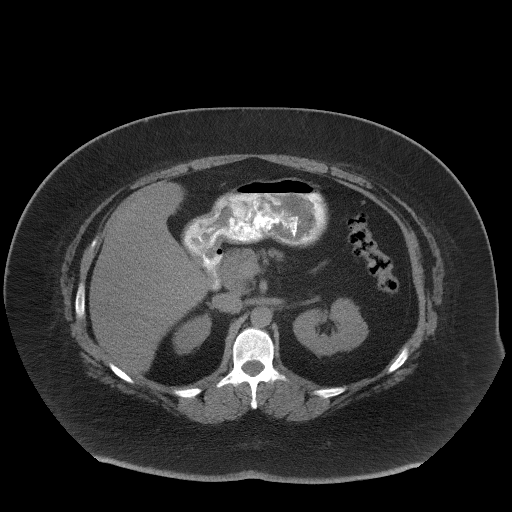
[im 31/61  soft-tissue]
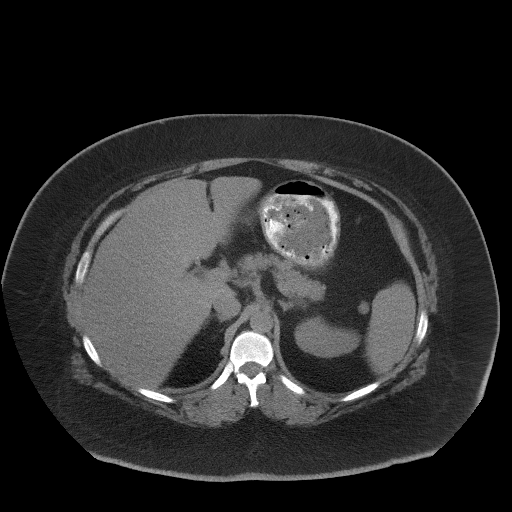
[im 36/61  soft-tissue]
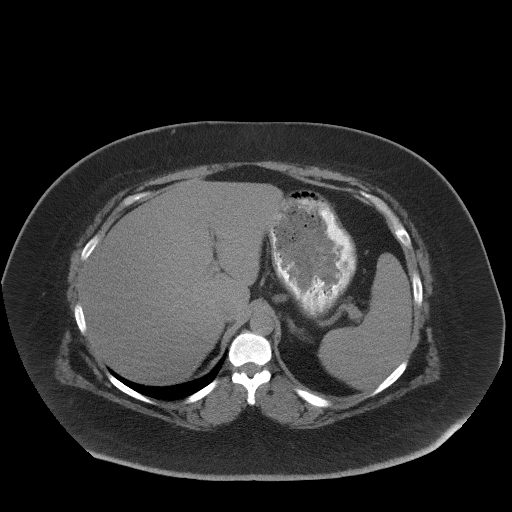
[im 41/61  soft-tissue]
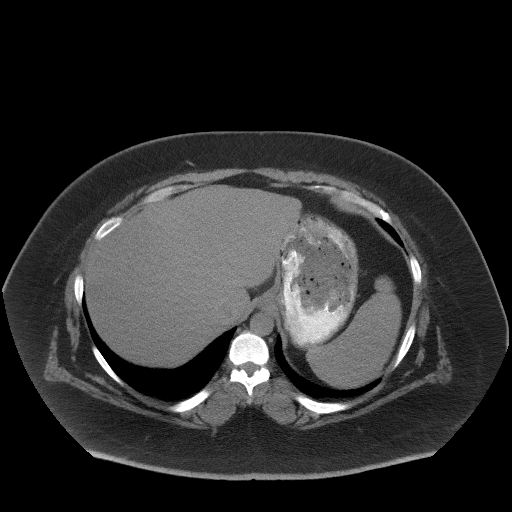
[im 46/61  soft-tissue]
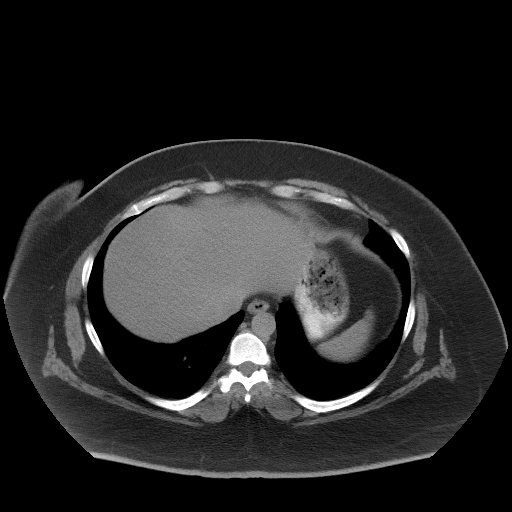
[im 46/61  bone]
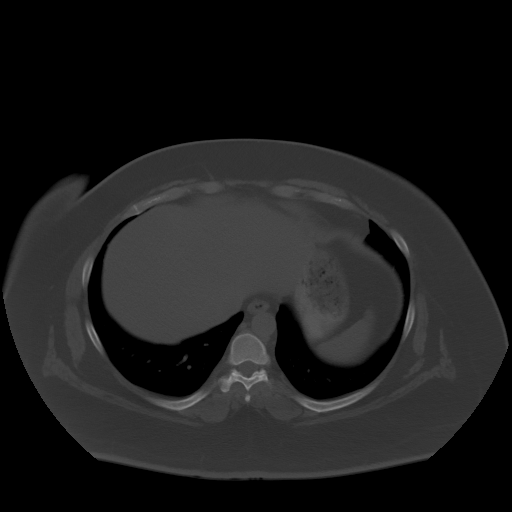
[im 51/61  soft-tissue]
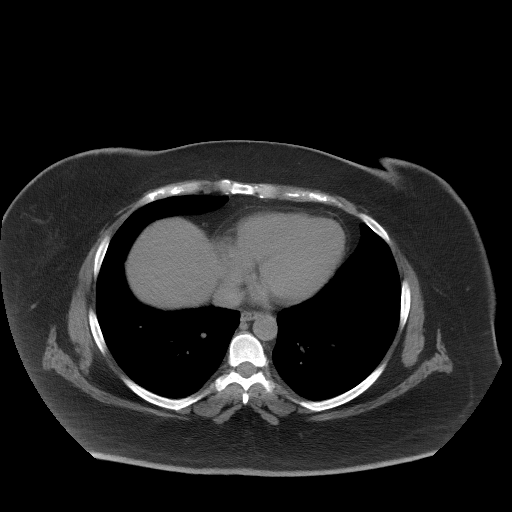
[im 56/61  soft-tissue]
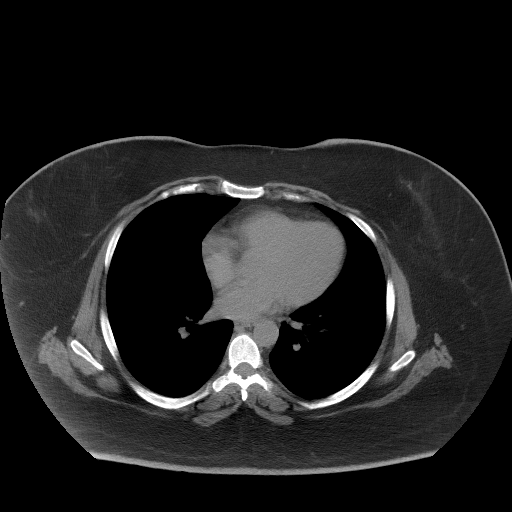

[Series 4: lung bases · axial · 0.95mm/px · 1 of 95 slices shown]
[im 11/95  bone]
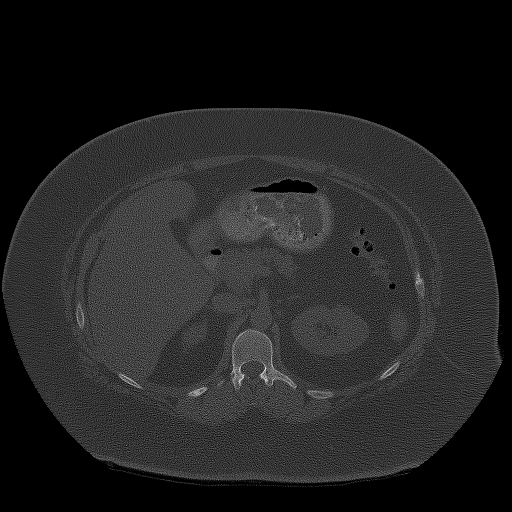

[Series 5: coronal st · coronal · 0.66mm/px · 3 of 125 slices shown]
[im 42/125  soft-tissue]
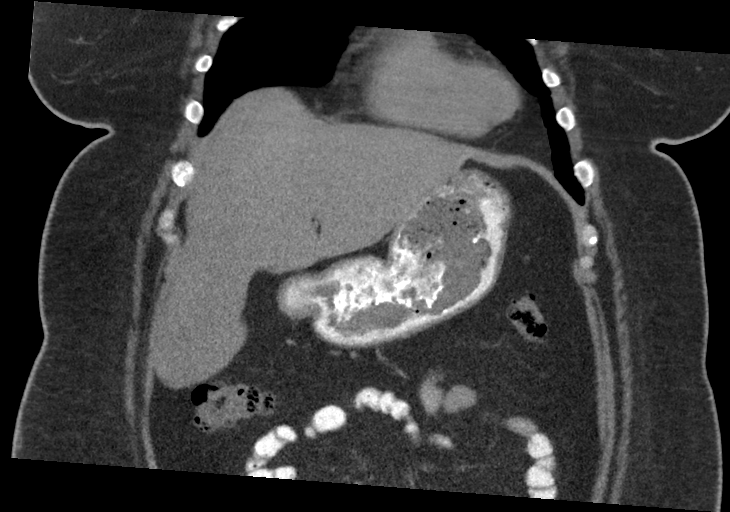
[im 56/125  soft-tissue]
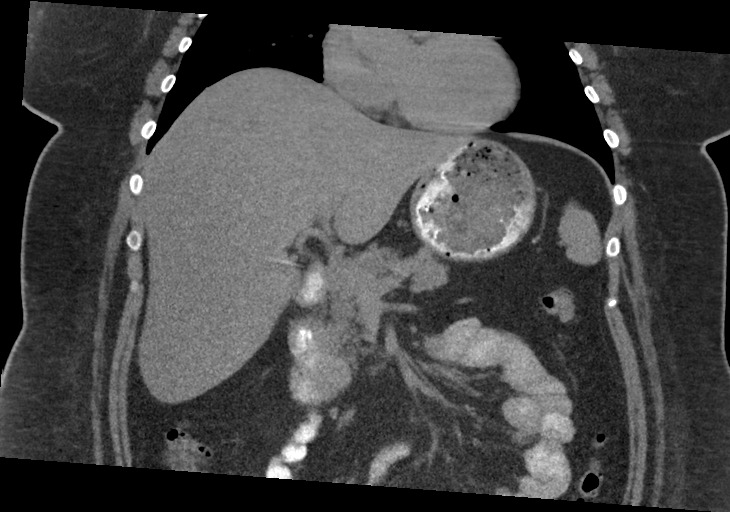
[im 69/125  soft-tissue]
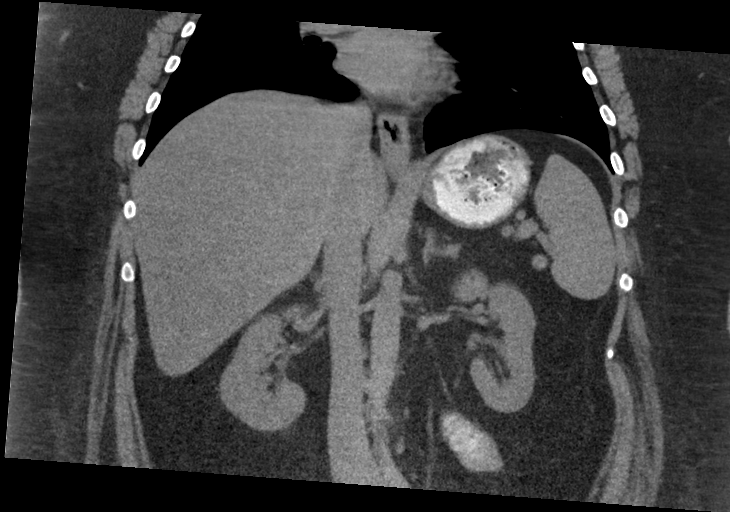

[15 of 46 positions shown; findings below may reference images not displayed]

FINDINGS: Lower chest: No acute abnormality.

Hepatobiliary: No focal liver abnormality is seen. Status post
cholecystectomy. No biliary dilatation.

Pancreas: Unremarkable. No pancreatic ductal dilatation or
surrounding inflammatory changes.

Spleen: Normal in size without significant abnormality.

Adrenals/Urinary Tract: Adrenal glands are unremarkable. Kidneys are
normal, without renal calculi, solid lesion, or hydronephrosis.
Bladder is unremarkable.

Stomach/Bowel: Stomach is within normal limits. Appendix appears
normal. No evidence of bowel wall thickening, distention, or
inflammatory changes. Colonic diverticulosis.

Vascular/Lymphatic: No significant vascular findings are present. No
enlarged abdominal or pelvic lymph nodes.

Other: No abdominal wall hernia or abnormality. No abdominopelvic
ascites.

Musculoskeletal: No acute or significant osseous findings.
IMPRESSION: 1. No acute noncontrast CT findings of the abdomen to explain right
upper quadrant pain.

2.  Status post cholecystectomy.

3. Diverticulosis of the partially included colon without evidence
of acute diverticulitis.

4. No obvious non-contrast evidence of metastatic disease in the
abdomen, given stated history of melanoma. Please note that lack of
intravenous contrast significantly limits sensitivity for metastatic
disease.

## 2021-02-21 NOTE — Patient Instructions (Addendum)
Moderate persistent asthma For asthma flares/upper respiratory tract infections start Airduo 232/14 mcg 1 puff twice a day until symptoms return to baseline May use albuterol 2 puffs every 4 hours as needed for cough, wheeze, tightness in chest, shortness of breath.  Also may use albuterol 2 puffs 5 to 15 minutes prior to exercise. Asthma control goals:  Full participation in all desired activities (may need albuterol before activity) Albuterol use two time or less a week on average (not counting use with activity) Cough interfering with sleep two time or less a month Oral steroids no more than once a year No hospitalizations  Seasonal and perennial allergic rhinitis(grass, ragweed, weeds, trees, molds, dust mite, cat, dog, cockroach) Continue Xyzal ( levocetirizine) 5 mg once a day as needed for runny nose or itchy Continue Astelin (azelastine) nasal spray using  2 sprays each nostril 1-2 times a day as needed for runny nose or drainage.  Use saline nasal rinse as needed for nasal symptoms.  Use this prior to any medicated nasal sprays.  Your blood pressure is elevated in the office today. Please schedule an appointment with your primary care physician  Please let us know if this treatment plan is not working well for you. Schedule a follow-up appointment in 6 months

## 2021-02-22 ENCOUNTER — Encounter: Payer: Self-pay | Admitting: Family

## 2021-02-22 ENCOUNTER — Ambulatory Visit: Payer: BC Managed Care – PPO | Admitting: Family

## 2021-02-22 ENCOUNTER — Other Ambulatory Visit: Payer: Self-pay

## 2021-02-22 VITALS — BP 160/110 | HR 75 | Temp 98.7°F | Resp 16

## 2021-02-22 DIAGNOSIS — J3089 Other allergic rhinitis: Secondary | ICD-10-CM | POA: Diagnosis not present

## 2021-02-22 DIAGNOSIS — J4540 Moderate persistent asthma, uncomplicated: Secondary | ICD-10-CM | POA: Diagnosis not present

## 2021-02-22 DIAGNOSIS — J302 Other seasonal allergic rhinitis: Secondary | ICD-10-CM

## 2021-02-22 NOTE — Progress Notes (Signed)
RE: Brianna Bowen MRN: [id number] DOB: [birth year] Date of Telemedicine Visit: 02/22/2021  Referring provider: Celene Squibb, MD Primary care provider: Celene Squibb, MD  Chief Complaint: Asthma (Doing good)   Telemedicine Follow Up Visit via Telephone: I connected with Brianna Bowen for a follow up on 02/22/21 by telephone and verified that I am speaking with the correct person using two identifiers.   I discussed the limitations, risks, security and privacy concerns of performing an evaluation and management service by telephone and the availability of in person appointments. I also discussed with the patient that there may be a patient responsible charge related to this service. The patient expressed understanding and agreed to proceed.  Patient is at the office  Provider is at home.  Visit start time: 10:37 AM Visit end time: 11 AM Insurance consent/check in by: [place] consent and medical assistant/nurse: Oda Kilts.  History of Present Illness: She is a 40 y.o. female, who is being followed for moderate persistent asthma and seasonal and perennial allergic rhinitis. Her previous allergy office visit was on September 28, 2020 with  Althea Charon, FNP .   Moderate persistent asthma is reported as controlled with AirDuo 232/14 mcg as needed for asthma flares/upper respiratory infections and albuterol as needed.  She denies any coughing, wheezing, tightness in her chest, shortness of breath, and nocturnal awakenings.  Since her last office visit she has not required any systemic steroids or made any trips to the emergency room or urgent care due to breathing problems.  She has not had to use her albuterol inhaler since we last saw her.  She reports that she has used AirDuo 232/14 mcg a few times since we last saw her.  Most recently she used it this morning driving to the office.  She reports that she just finished mowing her mom's yard.  Seasonal and perennial allergic rhinitis  is reported as moderately controlled with Xyzal 5 mg once a day and Astelin nasal spray as needed.  She reports a little bit of postnasal drip in the morning that is not enough to report and denies rhinorrhea and nasal congestion.  She has not had any sinus infections since we last saw her.  Discussed with Gaynell how her blood pressure was extremely high while in the office.  She reports that blood pressure is never that high when she is at her primary care physicians.  She reports that she has an appointment with her primary care next week for a physical.  Assessment and Plan: Quaniya is a 40 y.o. female with: Patient Instructions  Moderate persistent asthma For asthma flares/upper respiratory tract infections start Airduo 232/14 mcg 1 puff twice a day until symptoms return to baseline May use albuterol 2 puffs every 4 hours as needed for cough, wheeze, tightness in chest, shortness of breath.  Also may use albuterol 2 puffs 5 to 15 minutes prior to exercise. Asthma control goals:  Full participation in all desired activities (may need albuterol before activity) Albuterol use two time or less a week on average (not counting use with activity) Cough interfering with sleep two time or less a month Oral steroids no more than once a year No hospitalizations  Seasonal and perennial allergic rhinitis(grass, ragweed, weeds, trees, molds, dust mite, cat, dog, cockroach) Continue Xyzal ( levocetirizine) 5 mg once a day as needed for runny nose or itchy Continue Astelin (azelastine) nasal spray using  2 sprays each nostril 1-2 times a day as  needed for runny nose or drainage.  Use saline nasal rinse as needed for nasal symptoms.  Use this prior to any medicated nasal sprays.  Your blood pressure is elevated in the office today. Please schedule an appointment with your primary care physician  Please let us know if this treatment plan is not working well for you. Schedule a follow-up appointment in 6  months  Return in about 6 months (around 08/25/2021), or if symptoms worsen or fail to improve.  No orders of the defined types were placed in this encounter.  Lab Orders  No laboratory test(s) ordered today    Diagnostics: None.  Medication List:  Current Outpatient Medications  Medication Sig Dispense Refill   AIRDUO DIGIHALER 232-14 MCG/ACT AEPB Inhale 1 puff into the lungs in the morning and at bedtime. 1 each 0   albuterol (PROAIR HFA) 108 (90 Base) MCG/ACT inhaler Inhale 2 puffs into the lungs every 4 (four) hours as needed for wheezing or shortness of breath (cough). 1 Inhaler 1   Albuterol Sulfate, sensor, (PROAIR DIGIHALER) 108 (90 Base) MCG/ACT AEPB Inhale 2 puffs into the lungs every 4 (four) hours as needed. 1 each 1   ALPRAZolam (XANAX) 0.5 MG tablet alprazolam 0.5 mg tablet     azelastine (ASTELIN) 0.1 % nasal spray 2 sprays per nostril 1-2 times daily needed. 30 mL 5   levocetirizine (XYZAL) 5 MG tablet Take 1 tablet (5 mg total) by mouth every evening. 30 tablet 5   gabapentin (NEURONTIN) 100 MG capsule Take 1 capsule by mouth at bedtime.     metFORMIN (GLUCOPHAGE) 500 MG tablet metformin 500 mg tablet     No current facility-administered medications for this visit.   Allergies:  I reviewed her past medical history, social history, family history, and environmental history and no significant changes have been reported from previous visit on September 28, 2020.  Review of Systems  Constitutional:  Negative for chills and fever.  HENT:         Reports a little bit of post nasal drip in the morning and denies rhinorrhea and nasal congestion  Eyes:  Negative for itching.  Respiratory:  Negative for cough, chest tightness, shortness of breath and wheezing.   Cardiovascular:  Negative for chest pain and palpitations.  Gastrointestinal:        Denies heartburn and reflux symptoms  Genitourinary:  Negative for dysuria.  Skin:  Negative for rash.  Allergic/Immunologic:  Positive for environmental allergies.  Neurological:  Negative for headaches.  Objective: Physical Exam Not obtained as encounter was done via telephone.   Previous notes and tests were reviewed.  I discussed the assessment and treatment plan with the patient. The patient was provided an opportunity to ask questions and all were answered. The patient agreed with the plan and demonstrated an understanding of the instructions.   The patient was advised to call back or seek an in-person evaluation if the symptoms worsen or if the condition fails to improve as anticipated.  I provided 23 minutes of non-face-to-face time during this encounter.  It was my pleasure to participate in [place] care today. Please feel free to contact me with any questions or concerns.   Sincerely,  Althea Charon, FNP

## 2021-08-25 ENCOUNTER — Ambulatory Visit: Payer: BC Managed Care – PPO | Admitting: Family

## 2022-01-04 ENCOUNTER — Encounter: Payer: BC Managed Care – PPO | Admitting: Obstetrics & Gynecology

## 2022-01-29 ENCOUNTER — Encounter: Payer: Self-pay | Admitting: Obstetrics & Gynecology

## 2022-01-29 ENCOUNTER — Ambulatory Visit: Payer: BC Managed Care – PPO | Admitting: Obstetrics & Gynecology

## 2022-01-29 ENCOUNTER — Telehealth: Payer: Self-pay

## 2022-01-29 VITALS — BP 140/98 | HR 80 | Ht 63.5 in | Wt 286.0 lb

## 2022-01-29 DIAGNOSIS — Z6841 Body Mass Index (BMI) 40.0 and over, adult: Secondary | ICD-10-CM

## 2022-01-29 DIAGNOSIS — N979 Female infertility, unspecified: Secondary | ICD-10-CM

## 2022-01-29 DIAGNOSIS — N970 Female infertility associated with anovulation: Secondary | ICD-10-CM | POA: Diagnosis not present

## 2022-01-29 NOTE — Progress Notes (Signed)
    Brianna Bowen [birth year] [id number]        41 y.o.  G0 Married x 2 years  RP:  Oligo-ovulation and primary infertility  HPI: Married x 2 years without contraception.  Actively attempting to conceive since 06/2021.  Patient is G0 at 41 yo, never attempted conception before.  Menses are every 30-35 days with normal menstrual flow.  No BTB.  No pelvic pain.  No h/o STIs, especially no Gono or Chlam.  No previous Gyn surgery. Patient is obese, BMI 49.87.  Husband has no paternity.  No h/o testicular disorder or trauma.   OB History  No obstetric history on file.    Past medical history,surgical history, problem list, medications, allergies, family history and social history were all reviewed and documented in the EPIC chart.   Directed ROS with pertinent positives and negatives documented in the history of present illness/assessment and plan.  Exam:  Vitals:   01/29/22 0947  BP: (!) 140/98  Pulse: 80  SpO2: 98%  Weight: 286 lb (129.7 kg)  Height: 5' 3.5" (1.613 m)   General appearance:  Normal  Abdomen: Normal  Gynecologic exam: Vulva normal.  Bimanual exam:  Uterus AV, normal volume, mobile, NT.  No adnexal mass felt, NT.   Assessment/Plan:  41 y.o. No obstetric history on file.   1. Oligo-ovulation Married x 2 years without contraception.  Actively attempting to conceive since 06/2021.  Patient is G0 at 41 yo, never attempted conception before.  Menses are every 30-35 days with normal menstrual flow.  No BTB.  No pelvic pain.  No h/o STIs, especially no Gono or Chlam.  No previous Gyn surgery. Patient is obese, BMI 49.87.  Husband has no paternity.  No h/o testicular disorder or trauma.  Normal Gyn exam today, but will further investigate with a Pelvic US.  Possible PCOS/Oligo-Ovulation.  F/U Labs on day #21 to verify for Ovulation status and other hormones which could interfere with ovulation.  Management per results.  Will consider stimulation of Ovulation with Clomid  per findings. 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  Will also investigate for a tubal factor with a HSG under Doxy and Semen Analysis to r/o a female factor infertility.  Recommend to start on PNVs. - Prolactin; Future - TSH; Future - HgB A1c; Future - Progesterone; Future - Semen Analysis, Basic - DG Hysterogram (HSG); Future - US Transvaginal Non-OB; Future  3. Class 3 severe obesity due to excess calories without serious comorbidity with body mass index (BMI) of 45.0 to 49.9 in adult [facility]) Lower calorie/carb diet.  Increase fitness activities.    Other orders - labetalol (NORMODYNE) 100 MG tablet; TAKE (1) TABLET BY MOUTH AT BEDTIME. - tirzepatide (MOUNJARO) 7.5 MG/0.5ML Pen; INJECT 7.5 MG INTO THE SKIN ONCE WEEKLY. - Prenatal Vit-Fe Fumarate-FA (PRENATAL PO); Take by mouth.   Counseling and management of oligoovulation and primary infertility, as well as obesity, with review of documentation, for 45 minutes.  Genia Del MD, 10:18 AM 01/29/2022

## 2022-01-29 NOTE — Telephone Encounter (Signed)
Spoke with patient and confirmed that she did receive a semen analysis kit today.  I advised her there is a release form in the kit that will need to be completed by her partner and returned to the office. Referral will be faxed to [facility] once that form is received.

## 2022-01-29 NOTE — Telephone Encounter (Signed)
Brianna Bruins, MD  [facility] Triage Please schedule Sperm Analysis for husband.  Patient will call back with LMP to schedule HSG under Doxy 100 mg BID x 3 days and also schedule Labs on day #21.

## 2022-01-31 ENCOUNTER — Encounter: Payer: Self-pay | Admitting: Obstetrics & Gynecology

## 2022-04-04 ENCOUNTER — Telehealth: Payer: Self-pay | Admitting: *Deleted

## 2022-04-04 NOTE — Telephone Encounter (Signed)
Patient was scheduled for pelvic ultrasound on 04/05/22 for Primary female infertility, she had to cancel due to her work. Patient was told to come in on day 21 of cycle for progesterone level. She reports the last 2 months day 21 of cycle had fell on a weekend. What to do it this happens again? She also asked if progesterone needs to be done prior to ultrasound? Please advise

## 2022-04-04 NOTE — Telephone Encounter (Signed)
Dr.Lavoie replied "If day 21 is Saturday, come Friday, if day 21 is Sunday, come Monday.  The order of Pelvic US and Day 21 Progesterone doesn't matter. "   Patient informed with the above.

## 2022-04-05 ENCOUNTER — Other Ambulatory Visit: Payer: BC Managed Care – PPO | Admitting: Obstetrics & Gynecology

## 2022-04-05 ENCOUNTER — Other Ambulatory Visit: Payer: BC Managed Care – PPO

## 2022-07-30 ENCOUNTER — Telehealth: Payer: Self-pay | Admitting: Physician Assistant

## 2022-07-30 DIAGNOSIS — J019 Acute sinusitis, unspecified: Secondary | ICD-10-CM

## 2022-07-30 DIAGNOSIS — B9689 Other specified bacterial agents as the cause of diseases classified elsewhere: Secondary | ICD-10-CM

## 2022-07-30 MED ORDER — DOXYCYCLINE HYCLATE 100 MG PO TABS: 100.0000 mg | ORAL_TABLET | Freq: Two times a day (BID) | ORAL | 0 refills | Status: DC

## 2022-07-30 NOTE — Progress Notes (Signed)
E-Visit for Sinus Problems  We are sorry that you are not feeling well.  Here is how we plan to help!  Based on what you have shared with me it looks like you have sinusitis.  Sinusitis is inflammation and infection in the sinus cavities of the head.  Based on your presentation I believe you most likely have Acute Bacterial Sinusitis.  This is an infection caused by bacteria and is treated with antibiotics. I have prescribed Doxycycline 100mg by mouth twice a day for 10 days. You may use an oral decongestant such as Mucinex D or if you have glaucoma or high blood pressure use plain Mucinex. Saline nasal spray help and can safely be used as often as needed for congestion.  If you develop worsening sinus pain, fever or notice severe headache and vision changes, or if symptoms are not better after completion of antibiotic, please schedule an appointment with a health care provider.    Sinus infections are not as easily transmitted as other respiratory infection, however we still recommend that you avoid close contact with loved ones, especially the very young and elderly.  Remember to wash your hands thoroughly throughout the day as this is the number one way to prevent the spread of infection!  Home Care: Only take medications as instructed by your medical team. Complete the entire course of an antibiotic. Do not take these medications with alcohol. A steam or ultrasonic humidifier can help congestion.  You can place a towel over your head and breathe in the steam from hot water coming from a faucet. Avoid close contacts especially the very young and the elderly. Cover your mouth when you cough or sneeze. Always remember to wash your hands.  Get Help Right Away If: You develop worsening fever or sinus pain. You develop a severe head ache or visual changes. Your symptoms persist after you have completed your treatment plan.  Make sure you Understand these instructions. Will watch your  condition. Will get help right away if you are not doing well or get worse.  Thank you for choosing an e-visit.  Your e-visit answers were reviewed by a board certified advanced clinical practitioner to complete your personal care plan. Depending upon the condition, your plan could have included both over the counter or prescription medications.  Please review your pharmacy choice. Make sure the pharmacy is open so you can pick up prescription now. If there is a problem, you may contact your provider through MyChart messaging and have the prescription routed to another pharmacy.  Your safety is important to us. If you have drug allergies check your prescription carefully.   For the next 24 hours you can use MyChart to ask questions about today's visit, request a non-urgent call back, or ask for a work or school excuse. You will get an email in the next two days asking about your experience. I hope that your e-visit has been valuable and will speed your recovery.  I have spent 5 minutes in review of e-visit questionnaire, review and updating patient chart, medical decision making and response to patient.   Elizet Kaplan M Osten Janek, PA-C  

## 2022-10-02 ENCOUNTER — Telehealth: Payer: Self-pay | Admitting: Family Medicine

## 2022-10-02 DIAGNOSIS — J329 Chronic sinusitis, unspecified: Secondary | ICD-10-CM

## 2022-10-02 NOTE — Progress Notes (Signed)
Because recent visit in Jan for symptoms that are similar, I feel your condition warrants further evaluation and I recommend that you be seen in a face to face visit.   NOTE: There will be NO CHARGE for this eVisit

## 2024-04-07 ENCOUNTER — Telehealth: Admitting: Family Medicine

## 2024-04-07 DIAGNOSIS — J029 Acute pharyngitis, unspecified: Secondary | ICD-10-CM | POA: Diagnosis not present

## 2024-04-07 NOTE — Progress Notes (Signed)
E-Visit for Sore Throat  We are sorry that you are not feeling well.  Here is how we plan to help!  Your symptoms indicate a likely viral infection (Pharyngitis).   Pharyngitis is inflammation in the back of the throat which can cause a sore throat, scratchiness and sometimes difficulty swallowing.   Pharyngitis is typically caused by a respiratory virus and will just run its course.  Please keep in mind that your symptoms could last up to 10 days.  For throat pain, we recommend over the counter oral pain relief medications such as acetaminophen or aspirin, or anti-inflammatory medications such as ibuprofen or naproxen sodium.  Topical treatments such as oral throat lozenges or sprays may be used as needed.  Avoid close contact with loved ones, especially the very young and elderly.  Remember to wash your hands thoroughly throughout the day as this is the number one way to prevent the spread of infection and wipe down door knobs and counters with disinfectant.  After careful review of your answers, I would not recommend an antibiotic for your condition.  Antibiotics should not be used to treat conditions that we suspect are caused by viruses like the virus that causes the common cold or flu. However, some people can have Strep with atypical symptoms. You may need formal testing in clinic or office to confirm if your symptoms continue or worsen.  Providers prescribe antibiotics to treat infections caused by bacteria. Antibiotics are very powerful in treating bacterial infections when they are used properly.  To maintain their effectiveness, they should be used only when necessary.  Overuse of antibiotics has resulted in the development of super bugs that are resistant to treatment!    Home Care: Only take medications as instructed by your medical team. Do not drink alcohol while taking these medications. A steam or ultrasonic humidifier can help congestion.  You can place a towel over your head and  breathe in the steam from hot water coming from a faucet. Avoid close contacts especially the very young and the elderly. Cover your mouth when you cough or sneeze. Always remember to wash your hands.  Get Help Right Away If: You develop worsening fever or throat pain. You develop a severe head ache or visual changes. Your symptoms persist after you have completed your treatment plan.  Make sure you Understand these instructions. Will watch your condition. Will get help right away if you are not doing well or get worse.   Thank you for choosing an e-visit.  Your e-visit answers were reviewed by a board certified advanced clinical practitioner to complete your personal care plan. Depending upon the condition, your plan could have included both over the counter or prescription medications.  Please review your pharmacy choice. Make sure the pharmacy is open so you can pick up prescription now. If there is a problem, you may contact your provider through MyChart messaging and have the prescription routed to another pharmacy.  Your safety is important to us. If you have drug allergies check your prescription carefully.   For the next 24 hours you can use MyChart to ask questions about today's visit, request a non-urgent call back, or ask for a work or school excuse. You will get an email in the next two days asking about your experience. I hope that your e-visit has been valuable and will speed your recovery.   I provided 5 minutes of non face-to-face time during this encounter for chart review, medication and order placement, as   well as and documentation.   

## 2024-05-02 ENCOUNTER — Telehealth: Admitting: Family Medicine

## 2024-05-02 DIAGNOSIS — B9789 Other viral agents as the cause of diseases classified elsewhere: Secondary | ICD-10-CM | POA: Diagnosis not present

## 2024-05-02 DIAGNOSIS — J329 Chronic sinusitis, unspecified: Secondary | ICD-10-CM | POA: Diagnosis not present

## 2024-05-02 MED ORDER — FLUTICASONE PROPIONATE 50 MCG/ACT NA SUSP: 2.0000 | Freq: Every day | NASAL | 6 refills | Status: AC

## 2024-05-02 NOTE — Progress Notes (Signed)
 We are sorry that you are not feeling well.  Here is how we plan to help!  Based on what you have shared with me it looks like you have sinusitis.  Sinusitis is inflammation and infection in the sinus cavities of the head.  Based on your presentation I believe you most likely have Acute Viral Sinusitis.This is an infection most likely caused by a virus. There is not specific treatment for viral sinusitis other than to help you with the symptoms until the infection runs its course.  You may use an oral decongestant such as Mucinex D or if you have glaucoma or high blood pressure use plain Mucinex. Saline nasal spray help and can safely be used as often as needed for congestion, I have prescribed: Fluticasone nasal spray two sprays in each nostril once a day  Some authorities believe that zinc sprays or the use of Echinacea may shorten the course of your symptoms.  Sinus infections are not as easily transmitted as other respiratory infection, however we still recommend that you avoid close contact with loved ones, especially the very young and elderly.  Remember to wash your hands thoroughly throughout the day as this is the number one way to prevent the spread of infection!  Home Care: Only take medications as instructed by your medical team. Do not take these medications with alcohol. A steam or ultrasonic humidifier can help congestion.  You can place a towel over your head and breathe in the steam from hot water  coming from a faucet. Avoid close contacts especially the very young and the elderly. Cover your mouth when you cough or sneeze. Always remember to wash your hands.  Get Help Right Away If: You develop worsening fever or sinus pain. You develop a severe head ache or visual changes. Your symptoms persist after you have completed your treatment plan.  Make sure you Understand these instructions. Will watch your condition. Will get help right away if you are not doing well or get  worse.  Your e-visit answers were reviewed by a board certified advanced clinical practitioner to complete your personal care plan.  Depending on the condition, your plan could have included both over the counter or prescription medications.  If there is a problem please reply  once you have received a response from your provider.  Your safety is important to us .  If you have drug allergies check your prescription carefully.    You can use MyChart to ask questions about today's visit, request a non-urgent call back, or ask for a work or school excuse for 24 hours related to this e-Visit. If it has been greater than 24 hours you will need to follow up with your provider, or enter a new e-Visit to address those concerns.  You will get an e-mail in the next two days asking about your experience.  I hope that your e-visit has been valuable and will speed your recovery. Thank you for using e-visits.  I have spent 5 minutes in review of e-visit questionnaire, review and updating patient chart, medical decision making and response to patient.   Brianna Kelemen, FNP

## 2024-05-14 ENCOUNTER — Telehealth: Admitting: Physician Assistant

## 2024-05-14 DIAGNOSIS — B9689 Other specified bacterial agents as the cause of diseases classified elsewhere: Secondary | ICD-10-CM

## 2024-05-14 DIAGNOSIS — J019 Acute sinusitis, unspecified: Secondary | ICD-10-CM | POA: Diagnosis not present

## 2024-05-14 MED ORDER — DOXYCYCLINE HYCLATE 100 MG PO TABS: 100.0000 mg | ORAL_TABLET | Freq: Two times a day (BID) | ORAL | 0 refills | Status: DC

## 2024-05-14 MED ORDER — BENZONATATE 100 MG PO CAPS: 100.0000 mg | ORAL_CAPSULE | Freq: Three times a day (TID) | ORAL | 0 refills | Status: DC | PRN

## 2024-05-14 NOTE — Progress Notes (Signed)
 E-Visit for Sinus Problems  We are sorry that you are not feeling well.  Here is how we plan to help!  Based on what you have shared with me it looks like you have sinusitis.  Sinusitis is inflammation and infection in the sinus cavities of the head.  Based on your presentation I believe you most likely have Acute Bacterial Sinusitis.  This is an infection caused by bacteria and is treated with antibiotics. I have prescribed Doxycycline  100mg  by mouth twice a day for 7 days.  I have also sent in a prescription cough medication, Tessalon, to use as directed. You may use an oral decongestant such as Mucinex D or if you have glaucoma or high blood pressure use plain Mucinex. Saline nasal spray help and can safely be used as often as needed for congestion.  If you develop worsening sinus pain, fever or notice severe headache and vision changes, or if symptoms are not better after completion of antibiotic, please schedule an appointment with a health care provider.    Sinus infections are not as easily transmitted as other respiratory infection, however we still recommend that you avoid close contact with loved ones, especially the very young and elderly.  Remember to wash your hands thoroughly throughout the day as this is the number one way to prevent the spread of infection!  Home Care: Only take medications as instructed by your medical team. Complete the entire course of an antibiotic. Do not take these medications with alcohol. A steam or ultrasonic humidifier can help congestion.  You can place a towel over your head and breathe in the steam from hot water  coming from a faucet. Avoid close contacts especially the very young and the elderly. Cover your mouth when you cough or sneeze. Always remember to wash your hands.  Get Help Right Away If: You develop worsening fever or sinus pain. You develop a severe head ache or visual changes. Your symptoms persist after you have completed your  treatment plan.  Make sure you Understand these instructions. Will watch your condition. Will get help right away if you are not doing well or get worse.  Your e-visit answers were reviewed by a board certified advanced clinical practitioner to complete your personal care plan.  Depending on the condition, your plan could have included both over the counter or prescription medications.  If there is a problem please reply  once you have received a response from your provider.  Your safety is important to us .  If you have drug allergies check your prescription carefully.    You can use MyChart to ask questions about today's visit, request a non-urgent call back, or ask for a work or school excuse for 24 hours related to this e-Visit. If it has been greater than 24 hours you will need to follow up with your provider, or enter a new e-Visit to address those concerns.  You will get an e-mail in the next two days asking about your experience.  I hope that your e-visit has been valuable and will speed your recovery. Thank you for using e-visits.  I have spent 5 minutes in review of e-visit questionnaire, review and updating patient chart, medical decision making and response to patient.   Elsie Velma Lunger, PA-C

## 2024-06-11 ENCOUNTER — Ambulatory Visit: Admitting: Obstetrics and Gynecology

## 2024-06-11 ENCOUNTER — Other Ambulatory Visit (HOSPITAL_COMMUNITY)
Admission: RE | Admit: 2024-06-11 | Discharge: 2024-06-11 | Disposition: A | Source: Ambulatory Visit | Attending: Obstetrics and Gynecology | Admitting: Obstetrics and Gynecology

## 2024-06-11 ENCOUNTER — Encounter: Payer: Self-pay | Admitting: Obstetrics and Gynecology

## 2024-06-11 VITALS — Ht 63.5 in | Wt 264.0 lb

## 2024-06-11 DIAGNOSIS — E282 Polycystic ovarian syndrome: Secondary | ICD-10-CM

## 2024-06-11 DIAGNOSIS — Z1331 Encounter for screening for depression: Secondary | ICD-10-CM | POA: Diagnosis not present

## 2024-06-11 DIAGNOSIS — Z01419 Encounter for gynecological examination (general) (routine) without abnormal findings: Secondary | ICD-10-CM

## 2024-06-11 NOTE — Progress Notes (Signed)
 43 y.o. y.o. female here for annual exam. Patient's last menstrual period was 05/26/2024 (exact date). Period Cycle (Days): 28 Period Duration (Days): 4-6 Period Pattern: Regular Menstrual Flow: Moderate, Heavy Menstrual Control: Thin pad, Maxi pad, Tampon Dysmenorrhea: (!) Moderate Dysmenorrhea Symptoms: Cramping  Severe anxiety with coming to obgyn. Had a prior bad experience P 09/22/15 normal (Care Everywhere) denies any abnormal M 05/21/24 Married. Her and her husband have never conceived together or in other relationships. OTC OPK are negative Has regular cycles but they are heavy Has lost 50lbs on mounjaro Gardesil: none  Reports hair loss/thinning, decreased libido  Body mass index is 46.03 kg/m.     06/11/2024    3:25 PM  Depression screen PHQ 2/9  Decreased Interest 0  Down, Depressed, Hopeless 0  PHQ - 2 Score 0    Height 5' 3.5 (1.613 m), weight 264 lb (119.7 kg), last menstrual period 05/26/2024.  No results found for: DIAGPAP, HPVHIGH, ADEQPAP  GYN HISTORY: No results found for: DIAGPAP, HPVHIGH, ADEQPAP  OB History  Gravida Para Term Preterm AB Living  0 0 0 0 0 0  SAB IAB Ectopic Multiple Live Births  0 0 0 0 0    Past Medical History:  Diagnosis Date   Anxiety    Asthma    Cancer (HCC)     skin cancer [place]   Hypertension    Pneumonia    Recurrent upper respiratory infection (URI)    Urticaria    from penicillin    Past Surgical History:  Procedure Laterality Date   CHOLECYSTECTOMY N/A 05/31/2015   Procedure: LAPAROSCOPIC CHOLECYSTECTOMY WITH INTRAOPERATIVE CHOLANGIOGRAM;  Surgeon: Krystal Russell, MD;  Location: [facility] OR;  Service: General;  Laterality: N/A;   WISDOM TOOTH EXTRACTION      Current Outpatient Medications on File Prior to Visit  Medication Sig Dispense Refill   ALPRAZolam (XANAX) 0.5 MG tablet alprazolam 0.5 mg tablet     fluticasone  (FLONASE ) 50 MCG/ACT nasal spray Place 2 sprays into both nostrils  daily. 16 g 6   labetalol (NORMODYNE) 100 MG tablet TAKE (1) TABLET BY MOUTH AT BEDTIME.     losartan (COZAAR) 25 MG tablet Take 25 mg by mouth 2 (two) times daily.     pantoprazole (PROTONIX) 40 MG tablet Take 40 mg by mouth.     tirzepatide (MOUNJARO) 7.5 MG/0.5ML Pen INJECT 7.5 MG INTO THE SKIN ONCE WEEKLY.     No current facility-administered medications on file prior to visit.    Social History   Socioeconomic History   Marital status: Married    Spouse name: Not on file   Number of children: Not on file   Years of education: Not on file   Highest education level: Not on file  Occupational History   Occupation: school counselor  Tobacco Use   Smoking status: Never    Passive exposure: Never   Smokeless tobacco: Never  Vaping Use   Vaping status: Never Used  Substance and Sexual Activity   Alcohol use: Yes    Alcohol/week: 1.0 - 2.0 standard drink of alcohol    Types: 1 - 2 Standard drinks or equivalent per week    Comment: socially wine - monthly   Drug use: No   Sexual activity: Yes    Partners: Male    Birth control/protection: None  Other Topics Concern   Not on file  Social History Narrative   Not on file   Social Drivers of Health   Financial  Resource Strain: Low Risk  (07/12/2023)   Received from [facility]   Overall Financial Resource Strain (CARDIA)    Difficulty of Paying Living Expenses: Not hard at all  Food Insecurity: No Food Insecurity (07/12/2023)   Received from [facility]   Hunger Vital Sign    Within the past 12 months, you worried that your food would run out before you got the money to buy more.: Never true    Within the past 12 months, the food you bought just didn't last and you didn't have money to get more.: Never true  Transportation Needs: No Transportation Needs (07/12/2023)   Received from [facility] - Transportation    Lack of Transportation (Medical): No    Lack of Transportation (Non-Medical): No  Physical  Activity: Insufficiently Active (07/12/2023)   Received from [facility]   Exercise Vital Sign    On average, how many days per week do you engage in moderate to strenuous exercise (like a brisk walk)?: 4 days    On average, how many minutes do you engage in exercise at this level?: 30 min  Stress: No Stress Concern Present (07/12/2023)   Received from [facility] - Occupational Stress Questionnaire    Feeling of Stress : Only a little  Social Connections: Socially Integrated (07/12/2023)   Received from [facility]   Social Network    How would you rate your social network (family, work, friends)?: Good participation with social networks  Intimate Partner Violence: Not At Risk (07/12/2023)   Received from [facility]   HITS    Over the last 12 months how often did your partner physically hurt you?: Never    Over the last 12 months how often did your partner insult you or talk down to you?: Never    Over the last 12 months how often did your partner threaten you with physical harm?: Never    Over the last 12 months how often did your partner scream or curse at you?: Never    Family History  Problem Relation Age of Onset   Diabetes Mother    Hypertension Mother    Hyperlipidemia Mother    Allergic rhinitis Mother    Stroke Mother    Allergic rhinitis Father    Allergic rhinitis Sister    Diabetes Maternal Grandmother    Cancer Maternal Grandmother        ovarian   Hyperlipidemia Maternal Grandmother    Emphysema Maternal Grandfather    Diabetes Maternal Grandfather    Heart disease Maternal Grandfather    Hypertension Maternal Grandfather    Diabetes Paternal Grandfather    Heart disease Paternal Grandfather    Hyperlipidemia Paternal Grandfather    Hypertension Paternal Grandfather    Asthma Neg Hx    Eczema Neg Hx    Immunodeficiency Neg Hx    Urticaria Neg Hx    Angioedema Neg Hx          Patient's last  menstrual period was Patient's last menstrual period was 05/26/2024 (exact date)..           Review of Systems Alls systems reviewed and are negative.     OBGyn Exam    A:         Well Woman GYN exam PCO Considering if conception is an option  P:        Pap smear collected today Encouraged annual mammogram screening Colon cancer screening not indicated DXA not indicated Labs and immunizations ordered today Discussed IVF process and to have discussion with [facility]. Would need to stop mounjaro prior to conception with unknown safety data in pregnancy. At least 8 weeks before conception. Encouraged healthy lifestyle practices Encouraged daily MVI  No follow-ups on file.  Brianna Bowen

## 2024-06-15 LAB — CYTOLOGY - PAP
Adequacy: ABSENT
Comment: NEGATIVE
Diagnosis: NEGATIVE
High risk HPV: NEGATIVE

## 2024-06-16 ENCOUNTER — Ambulatory Visit: Payer: Self-pay | Admitting: Obstetrics and Gynecology

## 2024-06-16 ENCOUNTER — Encounter: Payer: Self-pay | Admitting: Obstetrics and Gynecology

## 2024-06-17 MED ORDER — METRONIDAZOLE 500 MG PO TABS: 500.0000 mg | ORAL_TABLET | Freq: Two times a day (BID) | ORAL | 0 refills | Status: AC

## 2024-06-17 NOTE — Telephone Encounter (Signed)
 06/11/24 PAP results and recommendations reviewed.   Flagyl  500 mg PO bid x7 days sent to pharmacy on file.

## 2025-06-16 ENCOUNTER — Ambulatory Visit: Admitting: Obstetrics and Gynecology
